# Patient Record
Sex: Male | Born: 1950 | Race: Black or African American | Hispanic: No | Marital: Married | Smoking: Current every day smoker
Health system: Southern US, Community
[De-identification: ages and names within clinical notes are randomized; demographics above are authoritative.]

## PROBLEM LIST (undated history)

## (undated) DIAGNOSIS — I10 Essential (primary) hypertension: Secondary | ICD-10-CM

## (undated) DIAGNOSIS — R413 Other amnesia: Secondary | ICD-10-CM

## (undated) DIAGNOSIS — F09 Unspecified mental disorder due to known physiological condition: Secondary | ICD-10-CM

## (undated) DIAGNOSIS — E7800 Pure hypercholesterolemia, unspecified: Secondary | ICD-10-CM

## (undated) HISTORY — DX: Other amnesia: R41.3

## (undated) HISTORY — DX: Pure hypercholesterolemia, unspecified: E78.00

## (undated) HISTORY — DX: Unspecified mental disorder due to known physiological condition: F09

## (undated) HISTORY — DX: Essential (primary) hypertension: I10

---

## 2016-10-02 ENCOUNTER — Encounter (HOSPITAL_COMMUNITY): Payer: Self-pay

## 2016-10-02 ENCOUNTER — Emergency Department (HOSPITAL_COMMUNITY): Payer: Medicare PPO

## 2016-10-02 ENCOUNTER — Emergency Department (HOSPITAL_COMMUNITY)
Admission: EM | Admit: 2016-10-02 | Discharge: 2016-10-03 | Disposition: A | Discharge status: Home / Self Care | Payer: Medicare PPO | Attending: Emergency Medicine | Admitting: Emergency Medicine

## 2016-10-02 DIAGNOSIS — Y999 Unspecified external cause status: Secondary | ICD-10-CM | POA: Insufficient documentation

## 2016-10-02 DIAGNOSIS — Y929 Unspecified place or not applicable: Secondary | ICD-10-CM | POA: Insufficient documentation

## 2016-10-02 DIAGNOSIS — W19XXXA Unspecified fall, initial encounter: Secondary | ICD-10-CM | POA: Diagnosis not present

## 2016-10-02 DIAGNOSIS — F17200 Nicotine dependence, unspecified, uncomplicated: Secondary | ICD-10-CM | POA: Insufficient documentation

## 2016-10-02 DIAGNOSIS — R4182 Altered mental status, unspecified: Secondary | ICD-10-CM | POA: Diagnosis present

## 2016-10-02 DIAGNOSIS — J09X2 Influenza due to identified novel influenza A virus with other respiratory manifestations: Secondary | ICD-10-CM | POA: Diagnosis not present

## 2016-10-02 DIAGNOSIS — Y939 Activity, unspecified: Secondary | ICD-10-CM | POA: Diagnosis not present

## 2016-10-02 DIAGNOSIS — Z79899 Other long term (current) drug therapy: Secondary | ICD-10-CM | POA: Diagnosis not present

## 2016-10-02 DIAGNOSIS — R791 Abnormal coagulation profile: Secondary | ICD-10-CM | POA: Diagnosis not present

## 2016-10-02 DIAGNOSIS — J101 Influenza due to other identified influenza virus with other respiratory manifestations: Secondary | ICD-10-CM

## 2016-10-02 LAB — COMPREHENSIVE METABOLIC PANEL
ALK PHOS: 47 U/L (ref 38–126)
ALT: 12 U/L — AB (ref 17–63)
ANION GAP: 10 (ref 5–15)
AST: 45 U/L — ABNORMAL HIGH (ref 15–41)
Albumin: 4.4 g/dL (ref 3.5–5.0)
BUN: 19 mg/dL (ref 6–20)
CALCIUM: 8.9 mg/dL (ref 8.9–10.3)
CO2: 24 mmol/L (ref 22–32)
CREATININE: 1.23 mg/dL (ref 0.61–1.24)
Chloride: 105 mmol/L (ref 101–111)
GFR, EST NON AFRICAN AMERICAN: 59 mL/min — AB (ref >60)
Glucose, Bld: 99 mg/dL (ref 65–99)
Potassium: 3.5 mmol/L (ref 3.5–5.1)
Sodium: 139 mmol/L (ref 135–145)
Total Bilirubin: 0.8 mg/dL (ref 0.3–1.2)
Total Protein: 7.1 g/dL (ref 6.5–8.1)

## 2016-10-02 LAB — CBC WITH DIFFERENTIAL/PLATELET
BASOS ABS: 0 10*3/uL (ref 0.0–0.1)
BASOS PCT: 0 %
EOS ABS: 0 10*3/uL (ref 0.0–0.7)
EOS PCT: 0 %
HCT: 40 % (ref 39.0–52.0)
HEMOGLOBIN: 14 g/dL (ref 13.0–17.0)
Lymphocytes Relative: 9 %
Lymphs Abs: 0.8 10*3/uL (ref 0.7–4.0)
MCH: 32.3 pg (ref 26.0–34.0)
MCHC: 35 g/dL (ref 30.0–36.0)
MCV: 92.2 fL (ref 78.0–100.0)
Monocytes Absolute: 1 10*3/uL (ref 0.1–1.0)
Monocytes Relative: 12 %
NEUTROS PCT: 79 %
Neutro Abs: 6.7 10*3/uL (ref 1.7–7.7)
PLATELETS: 181 10*3/uL (ref 150–400)
RBC: 4.34 MIL/uL (ref 4.22–5.81)
RDW: 13.1 % (ref 11.5–15.5)
WBC: 8.4 10*3/uL (ref 4.0–10.5)

## 2016-10-02 LAB — I-STAT CG4 LACTIC ACID, ED: LACTIC ACID, VENOUS: 1.65 mmol/L (ref 0.5–1.9)

## 2016-10-02 LAB — CK: CK TOTAL: 1227 U/L — AB (ref 49–397)

## 2016-10-02 LAB — PROTIME-INR
INR: 1.13
PROTHROMBIN TIME: 14.5 s (ref 11.4–15.2)

## 2016-10-02 MED ORDER — SODIUM CHLORIDE 0.9 % IV BOLUS (SEPSIS)
1000.0000 mL | Freq: Once | INTRAVENOUS | Status: AC
  Administered 2016-10-03: 1000 mL via INTRAVENOUS

## 2016-10-02 NOTE — ED Triage Notes (Signed)
Pt presents with c/o altered mental status. Wife at bedside reports that pt fell last night while she was out of town and when she came home this morning, he was still lying on the floor from his fall last night. Unsure as to what caused the pt to fall, possible dizziness per wife. Pt has urinated on himself at this time, has a temp of 100.8 and is tachycardic. Pt's gait was unsteady as he ambulated to triage room and he is slow to answer questions appropriately.

## 2016-10-02 NOTE — ED Provider Notes (Signed)
[facility]-EMERGENCY DEPT Provider Note   CSN: [id number] Arrival date & time: 10/02/16  2140 By signing my name below, I, Bridgette HabermannMaria Tan, attest that this documentation has been prepared under the direction and in the presence of [facility] IncJaime Rhett Mutschler, PA-C. Electronically Signed: Bridgette HabermannMaria Tan, ED Scribe. 10/03/16. 12:06 AM.  History   Chief Complaint Chief Complaint  Patient presents with  . Altered Mental Status   The history is provided by the patient and the spouse. No language interpreter was used.   HPI Comments: Eddie Morris is a 66 y.o. male with no pertinent PMHx, who presents to the Emergency Department for evaluation. Wife states she was out of town and when she returned this morning at 3 pm, he was lying on the floor in the bedroom and states he has been there since the night prior due to a fall. Pt and wife are unsure what caused the fall but wife is concerned it was due to dizziness. Wife reports that pt has been moving much slower than usual, but otherwise pt's behavior is normal. Mental status normal. Patient endorses cough which has been worsening over the past 2-3 days. Patient denied knowing of fever at home, but was febrile upon arrival today. Pt denies dizziness, chills, back pain, chest pain, shortness of breath.   History reviewed. No pertinent past medical history.  There are no active problems to display for this patient.  History reviewed. No pertinent surgical history.     Home Medications    Prior to Admission medications   Medication Sig Start Date End Date Taking? Authorizing Provider  cholecalciferol (VITAMIN D) 1000 units tablet Take 1,000 Units by mouth daily.   Yes Historical Provider, MD  Multiple Vitamin (MULTIVITAMIN WITH MINERALS) TABS tablet Take 1 tablet by mouth daily.   Yes Historical Provider, MD  oseltamivir (TAMIFLU) 75 MG capsule Take 1 capsule (75 mg total) by mouth every 12 (twelve) hours. 10/03/16   Chase PicketJaime Pilcher Ivory Maduro, PA-C    Family History No  family history on file.  Social History Social History  Substance Use Topics  . Smoking status: Current Every Day Smoker  . Smokeless tobacco: Never Used  . Alcohol use Yes     Comment: occasionally      Allergies   Patient has no known allergies.   Review of Systems Review of Systems  Constitutional: Negative for chills.  Respiratory: Positive for cough.   Musculoskeletal: Positive for gait problem.  Psychiatric/Behavioral: Negative for confusion.  All other systems reviewed and are negative.  Physical Exam Updated Vital Signs BP 144/74   Pulse 92   Temp 100.8 F (38.2 C) (Oral)   Resp (!) 57   Ht 6\' 3"  (1.905 m)   Wt 88.1 kg   SpO2 96%   BMI 24.27 kg/m   Physical Exam  Constitutional: He is oriented to person, place, and time. He appears well-developed and well-nourished. No distress.  HENT:  Head: Normocephalic and atraumatic.  Oropharynx clear, dry mucous membranes.  Cardiovascular: Normal heart sounds.  Tachycardia present.   No murmur heard. Tachycardic but regular.  Pulmonary/Chest: Effort normal and breath sounds normal. No respiratory distress.  Abdominal: Soft. He exhibits no distension. There is no tenderness.  Musculoskeletal: Normal range of motion. He exhibits no edema or tenderness.  No C/T/L spine tenderness. No tenderness to palpation of upper or lower extremities. Pelvis is stable. Full ROM without pain. 5/5 muscle strength in all four extremities.  Neurological: He is alert and oriented to person, place, and  time. No cranial nerve deficit.  Skin: Skin is warm and dry.  Nursing note and vitals reviewed.    ED Treatments / Results  DIAGNOSTIC STUDIES: Oxygen Saturation is 93% on RA, poor by my interpretation.    COORDINATION OF CARE: 12:06 AM Discussed treatment plan with pt at bedside and pt agreed to plan.  Labs (all labs ordered are listed, but only abnormal results are displayed) Labs Reviewed  COMPREHENSIVE METABOLIC PANEL -  Abnormal; Notable for the following:       Result Value   AST 45 (*)    ALT 12 (*)    GFR calc non Af Amer 59 (*)    All other components within normal limits  URINALYSIS, ROUTINE W REFLEX MICROSCOPIC - Abnormal; Notable for the following:    Hgb urine dipstick LARGE (*)    Ketones, ur 5 (*)    Squamous Epithelial / LPF 0-5 (*)    All other components within normal limits  CK - Abnormal; Notable for the following:    Total CK 1,227 (*)    All other components within normal limits  INFLUENZA PANEL BY PCR (TYPE A & B, H1N1) - Abnormal; Notable for the following:    Influenza A By PCR POSITIVE (*)    All other components within normal limits  CULTURE, BLOOD (ROUTINE X 2)  CULTURE, BLOOD (ROUTINE X 2)  URINE CULTURE  CBC WITH DIFFERENTIAL/PLATELET  PROTIME-INR  I-STAT CG4 LACTIC ACID, ED    EKG  EKG Interpretation None       Radiology Dg Chest 2 View  Result Date: 10/02/2016 CLINICAL DATA:  Altered mental status and fever EXAM: CHEST  2 VIEW COMPARISON:  None. FINDINGS: Cardiomediastinal silhouette is enlarged. The aorta is ectatic. There is mild bibasilar atelectasis without focal consolidation. No pulmonary edema. No pneumothorax or sizable pleural effusion. IMPRESSION: No active cardiopulmonary disease. Electronically Signed   By: Deatra Robinson M.D.   On: 10/02/2016 22:36   Ct Head Wo Contrast  Result Date: 10/03/2016 CLINICAL DATA:  Found in the bedroom floor fell EXAM: CT HEAD WITHOUT CONTRAST TECHNIQUE: Contiguous axial images were obtained from the base of the skull through the vertex without intravenous contrast. COMPARISON:  None. FINDINGS: Brain: No evidence of acute infarction, hemorrhage, hydrocephalus, extra-axial collection or mass lesion/mass effect. Mild to moderate periventricular white matter hypodensity consistent with small vessel disease Vascular: No hyperdense vessels. Scattered calcifications at the carotid siphon Skull: Mastoid air cells are clear.  No skull  fracture Sinuses/Orbits: Mild mucosal thickening in the ethmoid and maxillary sinuses. No fracture. Other: None IMPRESSION: 1. No CT evidence for acute intracranial abnormality 2. Mild to moderate periventricular white matter hypodensity consistent with small vessel change Electronically Signed   By: Jasmine Pang M.D.   On: 10/03/2016 01:06    Procedures Procedures (including critical care time)  Medications Ordered in ED Medications  sodium chloride 0.9 % bolus 1,000 mL (0 mLs Intravenous Stopped 10/03/16 0218)  acetaminophen (TYLENOL) tablet 650 mg (650 mg Oral Given 10/03/16 0034)     Initial Impression / Assessment and Plan / ED Course  I have reviewed the triage vital signs and the nursing notes.  Pertinent labs & imaging results that were available during my care of the patient were reviewed by me and considered in my medical decision making (see chart for details).  Clinical Course    Eddie Morris is a 66 y.o. male who presents to ED for evaluation after being found down on the  floor this afternoon by wife. Patient states he fell last night and was too weak to get up. Patient is febrile 100.8 in triage. Upon exam, temp retaken and 101.2 - Tylenol given.  No focal neuro deficits on exam and mentating appropriately. CT head negative for acute findings. CXR and urine negative. CK of 1227 - IV fluids initiated. Flu PCR + for influenza A. Wife at bedside appears very reliable. I believe patient would be safe at home with close PCP follow up. Discussed option of admission vs. home with symptomatic care and close follow up. Patient and wife at bedside opt for discharge to home. Rx for tamiflu given and risks/benefits of medication discussed. Return precautions discussed. All questions answered.   Patient seen by and discussed with Dr. Jodi Mourning who agrees with treatment plan.    Final Clinical Impressions(s) / ED Diagnoses   Final diagnoses:  Influenza A    New Prescriptions New  Prescriptions   OSELTAMIVIR (TAMIFLU) 75 MG CAPSULE    Take 1 capsule (75 mg total) by mouth every 12 (twelve) hours.   I personally performed the services described in this documentation, which was scribed in my presence. The recorded information has been reviewed and is accurate.     [facility] Sajjad Honea, PA-C 10/03/16 0401    Blane Ohara, MD 10/03/16 [phone]

## 2016-10-03 ENCOUNTER — Emergency Department (HOSPITAL_COMMUNITY): Payer: Medicare PPO

## 2016-10-03 LAB — URINALYSIS, ROUTINE W REFLEX MICROSCOPIC
BILIRUBIN URINE: NEGATIVE
Bacteria, UA: NONE SEEN
GLUCOSE, UA: NEGATIVE mg/dL
KETONES UR: 5 mg/dL — AB
LEUKOCYTES UA: NEGATIVE
Nitrite: NEGATIVE
PROTEIN: NEGATIVE mg/dL
Specific Gravity, Urine: 1.014 (ref 1.005–1.030)
pH: 5 (ref 5.0–8.0)

## 2016-10-03 LAB — INFLUENZA PANEL BY PCR (TYPE A & B)
Influenza A By PCR: POSITIVE — AB
Influenza B By PCR: NEGATIVE

## 2016-10-03 MED ORDER — OSELTAMIVIR PHOSPHATE 75 MG PO CAPS: 75.0000 mg | ORAL_CAPSULE | Freq: Two times a day (BID) | ORAL | 0 refills | Status: DC

## 2016-10-03 MED ORDER — ACETAMINOPHEN 325 MG PO TABS
650.0000 mg | ORAL_TABLET | Freq: Once | ORAL | Status: AC
  Administered 2016-10-03: 650 mg via ORAL
  Filled 2016-10-03: qty 2

## 2016-10-03 NOTE — Discharge Instructions (Signed)
Increase hydration. Alternate between Tylenol and ibuprofen as needed for fever control. Follow up with your primary care provider for discussion of today's diagnosis.  Return to ER for difficulty breathing, if you are unable to keep fluids down, uncontrollable fever, new or worsening symptoms, any additional concerns.

## 2016-10-04 LAB — URINE CULTURE

## 2016-10-08 LAB — CULTURE, BLOOD (ROUTINE X 2)
CULTURE: NO GROWTH
Culture: NO GROWTH

## 2017-10-12 ENCOUNTER — Other Ambulatory Visit: Payer: Self-pay

## 2017-10-12 ENCOUNTER — Emergency Department (HOSPITAL_COMMUNITY): Payer: Medicare PPO

## 2017-10-12 ENCOUNTER — Encounter (HOSPITAL_COMMUNITY): Payer: Self-pay

## 2017-10-12 ENCOUNTER — Emergency Department (HOSPITAL_COMMUNITY)
Admission: EM | Admit: 2017-10-12 | Discharge: 2017-10-12 | Disposition: A | Discharge status: Home / Self Care | Payer: Medicare PPO | Attending: Emergency Medicine | Admitting: Emergency Medicine

## 2017-10-12 DIAGNOSIS — Z79899 Other long term (current) drug therapy: Secondary | ICD-10-CM | POA: Insufficient documentation

## 2017-10-12 DIAGNOSIS — R413 Other amnesia: Secondary | ICD-10-CM | POA: Diagnosis not present

## 2017-10-12 DIAGNOSIS — F17200 Nicotine dependence, unspecified, uncomplicated: Secondary | ICD-10-CM | POA: Insufficient documentation

## 2017-10-12 DIAGNOSIS — R410 Disorientation, unspecified: Secondary | ICD-10-CM | POA: Diagnosis not present

## 2017-10-12 LAB — URINALYSIS, ROUTINE W REFLEX MICROSCOPIC
Bilirubin Urine: NEGATIVE
GLUCOSE, UA: NEGATIVE mg/dL
Hgb urine dipstick: NEGATIVE
Ketones, ur: 5 mg/dL — AB
LEUKOCYTES UA: NEGATIVE
Nitrite: NEGATIVE
PH: 7 (ref 5.0–8.0)
Protein, ur: NEGATIVE mg/dL
SPECIFIC GRAVITY, URINE: 1.006 (ref 1.005–1.030)

## 2017-10-12 LAB — RAPID URINE DRUG SCREEN, HOSP PERFORMED
AMPHETAMINES: NOT DETECTED
BENZODIAZEPINES: NOT DETECTED
Barbiturates: NOT DETECTED
Cocaine: NOT DETECTED
Opiates: NOT DETECTED
Tetrahydrocannabinol: NOT DETECTED

## 2017-10-12 LAB — BASIC METABOLIC PANEL
ANION GAP: 12 (ref 5–15)
BUN: 11 mg/dL (ref 6–20)
CO2: 23 mmol/L (ref 22–32)
Calcium: 9.2 mg/dL (ref 8.9–10.3)
Chloride: 104 mmol/L (ref 101–111)
Creatinine, Ser: 1.18 mg/dL (ref 0.61–1.24)
GFR calc Af Amer: >60 mL/min (ref >60)
GFR calc non Af Amer: >60 mL/min (ref >60)
GLUCOSE: 95 mg/dL (ref 65–99)
POTASSIUM: 3.7 mmol/L (ref 3.5–5.1)
Sodium: 139 mmol/L (ref 135–145)

## 2017-10-12 LAB — HEPATIC FUNCTION PANEL
ALBUMIN: 4.2 g/dL (ref 3.5–5.0)
ALK PHOS: 63 U/L (ref 38–126)
ALT: 9 U/L — ABNORMAL LOW (ref 17–63)
AST: 25 U/L (ref 15–41)
Bilirubin, Direct: <0.1 mg/dL — ABNORMAL LOW (ref 0.1–0.5)
TOTAL PROTEIN: 7.3 g/dL (ref 6.5–8.1)
Total Bilirubin: 0.9 mg/dL (ref 0.3–1.2)

## 2017-10-12 LAB — CBC WITH DIFFERENTIAL/PLATELET
BASOS ABS: 0 10*3/uL (ref 0.0–0.1)
Basophils Relative: 0 %
Eosinophils Absolute: 0 10*3/uL (ref 0.0–0.7)
Eosinophils Relative: 0 %
HEMATOCRIT: 44.3 % (ref 39.0–52.0)
Hemoglobin: 15.4 g/dL (ref 13.0–17.0)
LYMPHS PCT: 17 %
Lymphs Abs: 1.2 10*3/uL (ref 0.7–4.0)
MCH: 32.4 pg (ref 26.0–34.0)
MCHC: 34.8 g/dL (ref 30.0–36.0)
MCV: 93.3 fL (ref 78.0–100.0)
MONO ABS: 0.7 10*3/uL (ref 0.1–1.0)
Monocytes Relative: 9 %
NEUTROS ABS: 5.5 10*3/uL (ref 1.7–7.7)
Neutrophils Relative %: 74 %
Platelets: 208 10*3/uL (ref 150–400)
RBC: 4.75 MIL/uL (ref 4.22–5.81)
RDW: 12.4 % (ref 11.5–15.5)
WBC: 7.5 10*3/uL (ref 4.0–10.5)

## 2017-10-12 LAB — ETHANOL

## 2017-10-12 NOTE — ED Notes (Addendum)
Spoke with PA Sharen Hecklaudia Gibbons about patient's situation and neuro exam stated that patient would need CT and lab work.
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
## 2017-10-12 NOTE — Discharge Instructions (Signed)
1.  See your doctor as soon as possible for recheck 2.  Return to the emergency department if any new or unusual symptoms develop.

## 2017-10-12 NOTE — ED Notes (Signed)
Spoke with Sharen Hecklaudia Gibbons, PA about patient's situation and
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
## 2017-11-03 DIAGNOSIS — R413 Other amnesia: Secondary | ICD-10-CM | POA: Diagnosis not present

## 2017-11-03 DIAGNOSIS — R030 Elevated blood-pressure reading, without diagnosis of hypertension: Secondary | ICD-10-CM | POA: Diagnosis not present

## 2017-11-06 ENCOUNTER — Encounter: Payer: Self-pay | Admitting: *Deleted

## 2017-11-07 ENCOUNTER — Ambulatory Visit: Payer: Medicare PPO | Admitting: Diagnostic Neuroimaging

## 2017-11-07 ENCOUNTER — Encounter: Payer: Self-pay | Admitting: Diagnostic Neuroimaging

## 2017-11-07 VITALS — BP 194/85 | HR 87 | Ht 73.0 in | Wt 194.0 lb

## 2017-11-07 DIAGNOSIS — F0390 Unspecified dementia without behavioral disturbance: Secondary | ICD-10-CM | POA: Diagnosis not present

## 2017-11-07 DIAGNOSIS — F03A0 Unspecified dementia, mild, without behavioral disturbance, psychotic disturbance, mood disturbance, and anxiety: Secondary | ICD-10-CM

## 2017-11-07 DIAGNOSIS — R413 Other amnesia: Secondary | ICD-10-CM | POA: Diagnosis not present

## 2017-11-07 NOTE — Patient Instructions (Signed)
-   increase safety and supervision  - caregiver support resources [email or web address]  - consider donepezil and memantine  - monitor mood and behavior issues  - optimize nutrition and physical activity
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
## 2017-12-01 DIAGNOSIS — R413 Other amnesia: Secondary | ICD-10-CM | POA: Diagnosis not present

## 2017-12-01 DIAGNOSIS — I10 Essential (primary) hypertension: Secondary | ICD-10-CM | POA: Diagnosis not present

## 2018-04-02 DIAGNOSIS — I10 Essential (primary) hypertension: Secondary | ICD-10-CM | POA: Diagnosis not present

## 2018-04-02 DIAGNOSIS — Z1211 Encounter for screening for malignant neoplasm of colon: Secondary | ICD-10-CM | POA: Diagnosis not present

## 2018-04-02 DIAGNOSIS — R413 Other amnesia: Secondary | ICD-10-CM | POA: Diagnosis not present

## 2018-04-03 ENCOUNTER — Telehealth: Payer: Self-pay | Admitting: Diagnostic Neuroimaging

## 2018-04-03 ENCOUNTER — Ambulatory Visit: Payer: Medicare PPO | Admitting: Diagnostic Neuroimaging

## 2018-04-03 ENCOUNTER — Encounter: Payer: Self-pay | Admitting: Diagnostic Neuroimaging

## 2018-04-03 VITALS — BP 146/73 | HR 78 | Ht 73.0 in | Wt 189.4 lb

## 2018-04-03 DIAGNOSIS — F0390 Unspecified dementia without behavioral disturbance: Secondary | ICD-10-CM

## 2018-04-03 DIAGNOSIS — F03A0 Unspecified dementia, mild, without behavioral disturbance, psychotic disturbance, mood disturbance, and anxiety: Secondary | ICD-10-CM

## 2018-04-03 NOTE — Patient Instructions (Signed)
DEMENTIA - increase safety and supervision - caregiver support resources reviewed - may increase donepezil to 10mg  daily; may consider adding memantine 10mg  twice a day  - monitor mood and behavior issues - optimize nutrition and physical activity - follow up MRI brain (ordered last visit; not done) --> call [phone] ([facility])

## 2018-04-03 NOTE — Telephone Encounter (Signed)
Francine GravenHumana Berkley Harveyauth: [phone] (exp. 04/03/18 to 05/03/18) order sent to GI they will reach out to the pt to schedule.
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
## 2018-04-09 ENCOUNTER — Ambulatory Visit
Admission: RE | Admit: 2018-04-09 | Discharge: 2018-04-09 | Disposition: A | Discharge status: Home / Self Care | Payer: Medicare PPO | Source: Ambulatory Visit | Attending: Diagnostic Neuroimaging | Admitting: Diagnostic Neuroimaging

## 2018-04-09 DIAGNOSIS — R413 Other amnesia: Secondary | ICD-10-CM | POA: Diagnosis not present

## 2018-05-22 DIAGNOSIS — K648 Other hemorrhoids: Secondary | ICD-10-CM | POA: Diagnosis not present

## 2018-05-22 DIAGNOSIS — K5730 Diverticulosis of large intestine without perforation or abscess without bleeding: Secondary | ICD-10-CM | POA: Diagnosis not present

## 2018-05-22 DIAGNOSIS — Z1211 Encounter for screening for malignant neoplasm of colon: Secondary | ICD-10-CM | POA: Diagnosis not present

## 2018-05-22 DIAGNOSIS — D126 Benign neoplasm of colon, unspecified: Secondary | ICD-10-CM | POA: Diagnosis not present

## 2018-05-25 DIAGNOSIS — Z1211 Encounter for screening for malignant neoplasm of colon: Secondary | ICD-10-CM | POA: Diagnosis not present

## 2018-05-25 DIAGNOSIS — D126 Benign neoplasm of colon, unspecified: Secondary | ICD-10-CM | POA: Diagnosis not present

## 2018-09-07 DIAGNOSIS — Z6825 Body mass index (BMI) 25.0-25.9, adult: Secondary | ICD-10-CM | POA: Diagnosis not present

## 2018-09-07 DIAGNOSIS — I10 Essential (primary) hypertension: Secondary | ICD-10-CM | POA: Diagnosis not present

## 2018-09-07 DIAGNOSIS — F0390 Unspecified dementia without behavioral disturbance: Secondary | ICD-10-CM | POA: Diagnosis not present

## 2018-10-04 DIAGNOSIS — Z23 Encounter for immunization: Secondary | ICD-10-CM | POA: Diagnosis not present

## 2018-10-04 DIAGNOSIS — F0391 Unspecified dementia with behavioral disturbance: Secondary | ICD-10-CM | POA: Diagnosis not present

## 2018-10-04 DIAGNOSIS — I10 Essential (primary) hypertension: Secondary | ICD-10-CM | POA: Diagnosis not present

## 2018-10-04 DIAGNOSIS — F0390 Unspecified dementia without behavioral disturbance: Secondary | ICD-10-CM | POA: Diagnosis not present

## 2018-10-04 DIAGNOSIS — F17210 Nicotine dependence, cigarettes, uncomplicated: Secondary | ICD-10-CM | POA: Diagnosis not present

## 2019-05-09 IMAGING — CT CT CERVICAL SPINE W/O CM
5 of 8 series · 11 of 33 positions shown, 12 images · non-contrast
Comparison: 10/03/2016 head CT.

CLINICAL DATA: Unexplained confusion.  No reported injury.

EXAM:
CT HEAD WITHOUT CONTRAST
CT CERVICAL SPINE WITHOUT CONTRAST
TECHNIQUE: Multidetector CT imaging of the head and cervical spine was
performed following the standard protocol without intravenous
contrast. Multiplanar CT image reconstructions of the cervical spine
were also generated.

[Series 5: head bone · axial · 0.52mm/px · z∈[-66,-12]mm · 2 of 83 slices shown]
[im 28/83  bone]
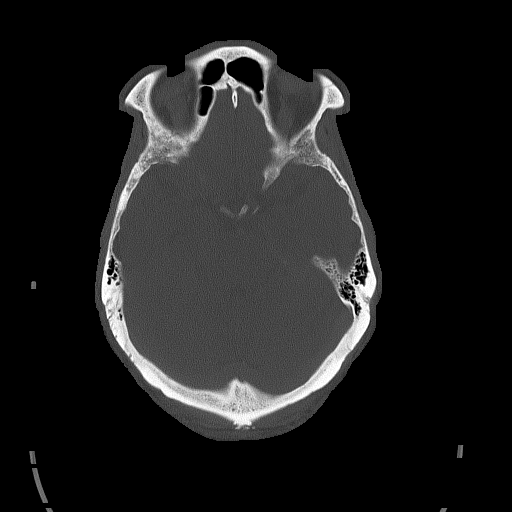
[im 55/83  bone]
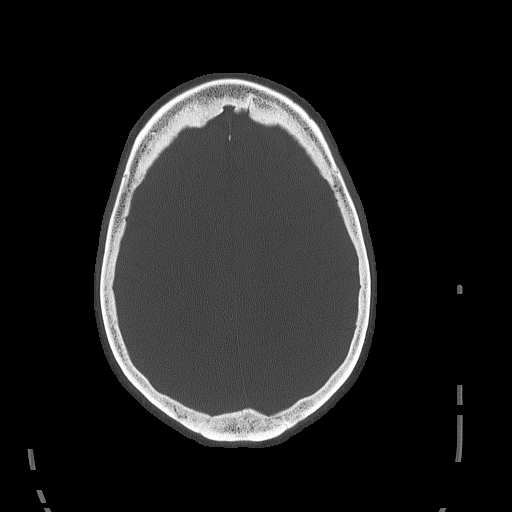

[Series 8: c_spine 2.0 st · axial · 0.37mm/px · z∈[-209,-145]mm · 2 of 96 slices shown, 3 images]
[im 32/96  soft-tissue]
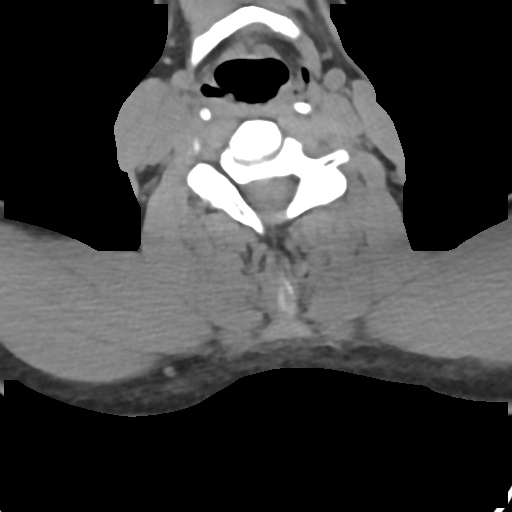
[im 32/96  bone]
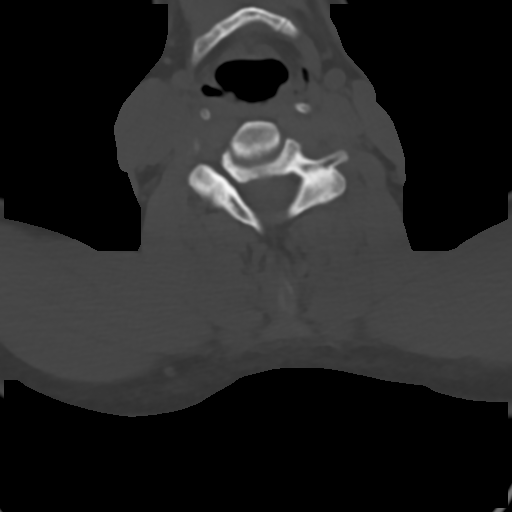
[im 64/96  bone]
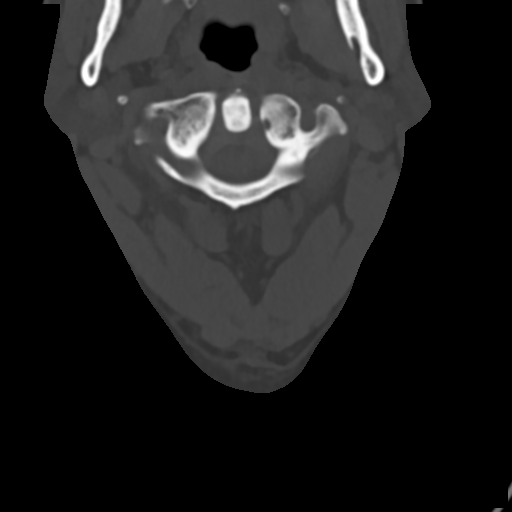

[Series 10: c_spine 2.0 sag bone · sagittal · 0.30mm/px · 4 of 64 slices shown]
[im 13/64  bone]
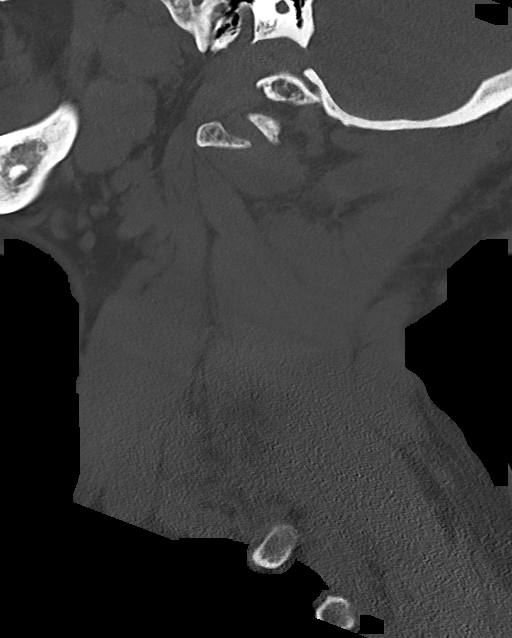
[im 26/64  bone]
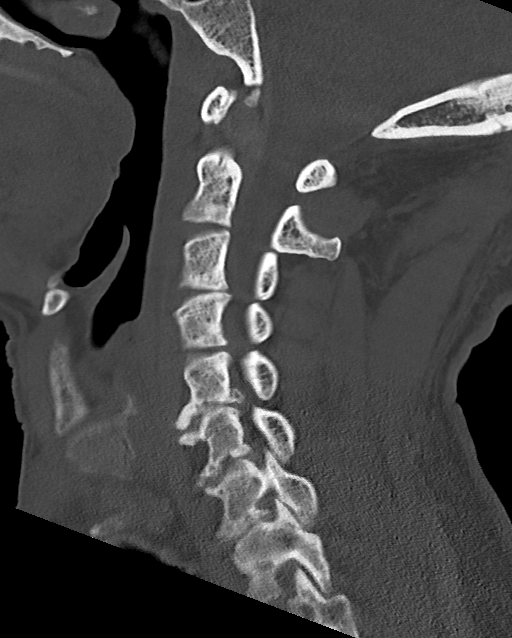
[im 38/64  bone]
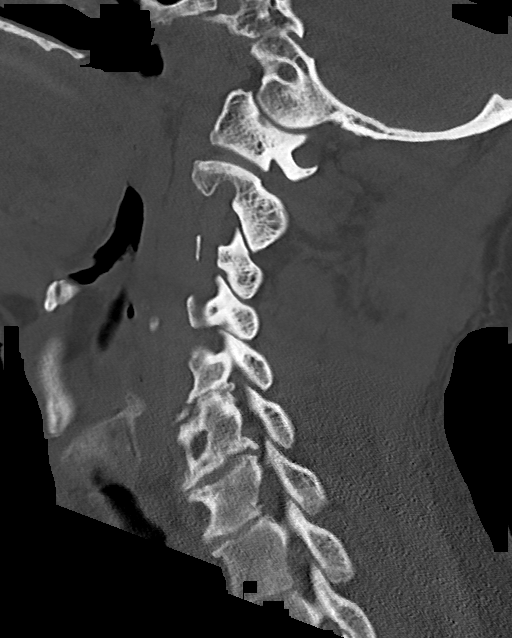
[im 51/64  bone]
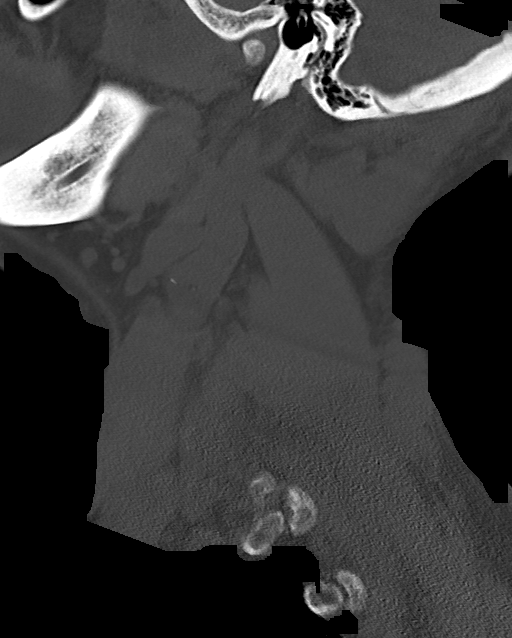

[Series 11: c_spine 2.0 cor bone · coronal · 0.28mm/px · 1 of 78 slices shown]
[im 39/78  bone]
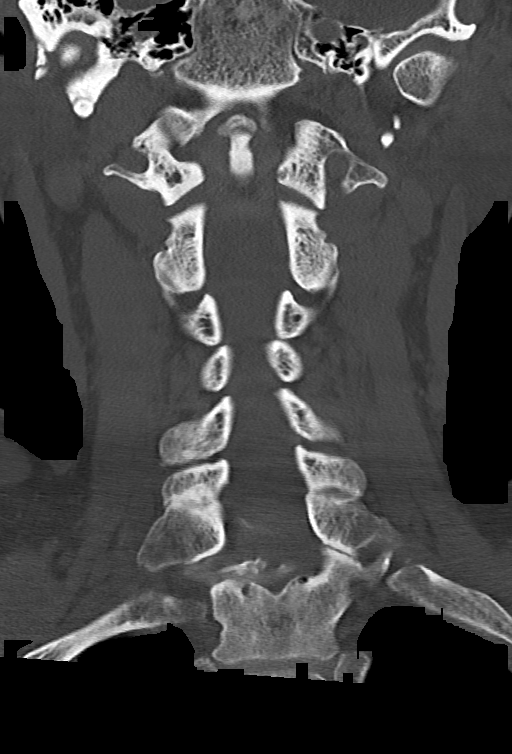

[Series 12: c_spine 2.0 orthogonals · oblique · 0.21mm/px · 2 of 86 slices shown]
[im 29/86  bone]
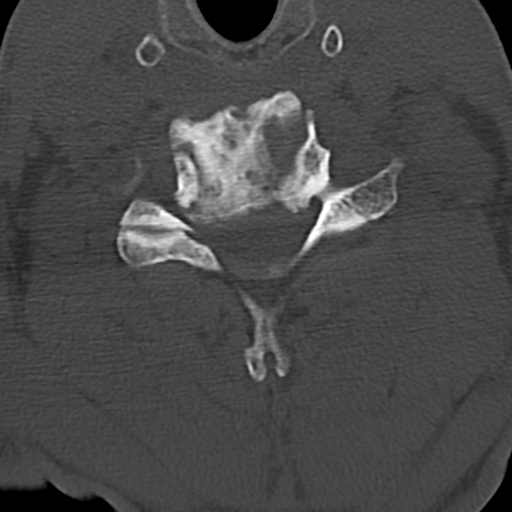
[im 57/86  bone]
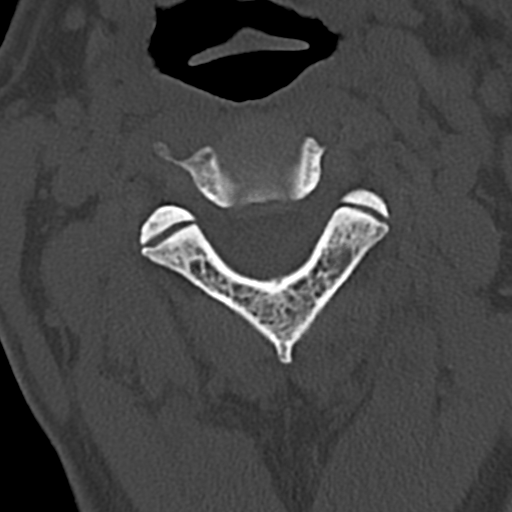

[11 of 33 positions shown; findings below may reference images not displayed]

FINDINGS: CT HEAD FINDINGS

Brain: No evidence of parenchymal hemorrhage or extra-axial fluid
collection. No mass lesion, mass effect, or midline shift. No CT
evidence of acute infarction. Nonspecific moderate subcortical and
periventricular white matter hypodensity, most in keeping with
chronic small vessel ischemic change. Cerebral volume is age
appropriate. No ventriculomegaly.

Vascular: No acute abnormality.

Skull: No evidence of calvarial fracture.

Sinuses/Orbits: The visualized paranasal sinuses are essentially
clear.

Other:  The mastoid air cells are unopacified.

CT CERVICAL SPINE FINDINGS

Alignment: Normal cervical lordosis. No facet subluxation. Dens is
well positioned between the lateral masses of C1.

Skull base and vertebrae: No acute fracture. No primary bone lesion
or focal pathologic process.

Soft tissues and spinal canal: No prevertebral edema. No visible
canal hematoma.

Disc levels: Moderate to severe multilevel degenerative disc disease
in the cervical spine, most prominent in the lower cervical spine.
Mild-to-moderate bilateral facet arthropathy. Mild to moderate
degenerative foraminal stenosis bilaterally at C3-4. Moderate right
and mild left degenerative foraminal stenosis at C5-6. Moderate
degenerative foraminal stenosis bilaterally at C6-7.

Upper chest: No acute abnormality.

Other: Visualized mastoid air cells appear clear. No discrete
thyroid nodules. No pathologically enlarged cervical nodes.
IMPRESSION: CT HEAD:

1.  No evidence of acute intracranial abnormality.
2. Moderate chronic small vessel ischemic changes in the cerebral
white matter.

CT CERVICAL SPINE:

1. No cervical spine fracture or subluxation.
2. Moderate to severe multilevel degenerative changes in the
cervical spine as detailed.

## 2019-05-17 DIAGNOSIS — I10 Essential (primary) hypertension: Secondary | ICD-10-CM | POA: Diagnosis not present

## 2019-05-17 DIAGNOSIS — F17210 Nicotine dependence, cigarettes, uncomplicated: Secondary | ICD-10-CM | POA: Diagnosis not present

## 2019-05-17 DIAGNOSIS — Z0000 Encounter for general adult medical examination without abnormal findings: Secondary | ICD-10-CM | POA: Diagnosis not present

## 2019-05-17 DIAGNOSIS — Z1389 Encounter for screening for other disorder: Secondary | ICD-10-CM | POA: Diagnosis not present

## 2019-05-17 DIAGNOSIS — F0390 Unspecified dementia without behavioral disturbance: Secondary | ICD-10-CM | POA: Diagnosis not present

## 2019-07-04 DIAGNOSIS — E7800 Pure hypercholesterolemia, unspecified: Secondary | ICD-10-CM | POA: Diagnosis not present

## 2019-11-13 DIAGNOSIS — F0390 Unspecified dementia without behavioral disturbance: Secondary | ICD-10-CM | POA: Diagnosis not present

## 2019-11-13 DIAGNOSIS — I10 Essential (primary) hypertension: Secondary | ICD-10-CM | POA: Diagnosis not present

## 2019-11-13 DIAGNOSIS — E7800 Pure hypercholesterolemia, unspecified: Secondary | ICD-10-CM | POA: Diagnosis not present

## 2019-12-31 ENCOUNTER — Encounter: Payer: Self-pay | Admitting: *Deleted

## 2020-01-01 ENCOUNTER — Ambulatory Visit: Payer: Medicare PPO | Admitting: Diagnostic Neuroimaging

## 2020-01-01 ENCOUNTER — Other Ambulatory Visit: Payer: Self-pay

## 2020-01-01 ENCOUNTER — Encounter: Payer: Self-pay | Admitting: Diagnostic Neuroimaging

## 2020-01-01 VITALS — BP 191/94 | HR 81 | Temp 97.2°F | Ht 72.0 in | Wt 201.8 lb

## 2020-01-01 DIAGNOSIS — F0391 Unspecified dementia with behavioral disturbance: Secondary | ICD-10-CM | POA: Diagnosis not present

## 2020-01-01 DIAGNOSIS — F03B18 Unspecified dementia, moderate, with other behavioral disturbance: Secondary | ICD-10-CM

## 2020-01-01 NOTE — Patient Instructions (Signed)
MODERATE DEMENTIA - continue to support safety and supervision - caregiver support resources reviewed - continue donepezil 10mg  daily + memantine 10mg  twice a day  - monitor mood and behavior issues; may consider SSRI in future - optimize nutrition and physical activity - consider melatonin for sleep
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
## 2020-05-26 DIAGNOSIS — F0391 Unspecified dementia with behavioral disturbance: Secondary | ICD-10-CM | POA: Diagnosis not present

## 2020-05-26 DIAGNOSIS — Z0000 Encounter for general adult medical examination without abnormal findings: Secondary | ICD-10-CM | POA: Diagnosis not present

## 2020-05-26 DIAGNOSIS — I10 Essential (primary) hypertension: Secondary | ICD-10-CM | POA: Diagnosis not present

## 2020-05-26 DIAGNOSIS — E7800 Pure hypercholesterolemia, unspecified: Secondary | ICD-10-CM | POA: Diagnosis not present

## 2020-06-11 DIAGNOSIS — Z23 Encounter for immunization: Secondary | ICD-10-CM | POA: Diagnosis not present

## 2020-06-11 DIAGNOSIS — I10 Essential (primary) hypertension: Secondary | ICD-10-CM | POA: Diagnosis not present

## 2020-11-23 DIAGNOSIS — F0391 Unspecified dementia with behavioral disturbance: Secondary | ICD-10-CM | POA: Diagnosis not present

## 2020-11-23 DIAGNOSIS — F17210 Nicotine dependence, cigarettes, uncomplicated: Secondary | ICD-10-CM | POA: Diagnosis not present

## 2020-11-23 DIAGNOSIS — I10 Essential (primary) hypertension: Secondary | ICD-10-CM | POA: Diagnosis not present

## 2020-11-23 DIAGNOSIS — E7800 Pure hypercholesterolemia, unspecified: Secondary | ICD-10-CM | POA: Diagnosis not present

## 2021-06-08 DIAGNOSIS — Z79899 Other long term (current) drug therapy: Secondary | ICD-10-CM | POA: Diagnosis not present

## 2021-06-08 DIAGNOSIS — Z1389 Encounter for screening for other disorder: Secondary | ICD-10-CM | POA: Diagnosis not present

## 2021-06-08 DIAGNOSIS — F0391 Unspecified dementia with behavioral disturbance: Secondary | ICD-10-CM | POA: Diagnosis not present

## 2021-06-08 DIAGNOSIS — I10 Essential (primary) hypertension: Secondary | ICD-10-CM | POA: Diagnosis not present

## 2021-06-08 DIAGNOSIS — R7989 Other specified abnormal findings of blood chemistry: Secondary | ICD-10-CM | POA: Diagnosis not present

## 2021-06-08 DIAGNOSIS — Z5181 Encounter for therapeutic drug level monitoring: Secondary | ICD-10-CM | POA: Diagnosis not present

## 2021-06-08 DIAGNOSIS — Z23 Encounter for immunization: Secondary | ICD-10-CM | POA: Diagnosis not present

## 2021-06-08 DIAGNOSIS — Z0000 Encounter for general adult medical examination without abnormal findings: Secondary | ICD-10-CM | POA: Diagnosis not present

## 2021-06-08 DIAGNOSIS — E7800 Pure hypercholesterolemia, unspecified: Secondary | ICD-10-CM | POA: Diagnosis not present

## 2021-06-16 DIAGNOSIS — R7989 Other specified abnormal findings of blood chemistry: Secondary | ICD-10-CM | POA: Diagnosis not present

## 2021-12-08 DIAGNOSIS — R739 Hyperglycemia, unspecified: Secondary | ICD-10-CM | POA: Diagnosis not present

## 2021-12-08 DIAGNOSIS — F0390 Unspecified dementia without behavioral disturbance: Secondary | ICD-10-CM | POA: Diagnosis not present

## 2021-12-08 DIAGNOSIS — I10 Essential (primary) hypertension: Secondary | ICD-10-CM | POA: Diagnosis not present

## 2021-12-08 DIAGNOSIS — N1831 Chronic kidney disease, stage 3a: Secondary | ICD-10-CM | POA: Diagnosis not present

## 2021-12-08 DIAGNOSIS — E7800 Pure hypercholesterolemia, unspecified: Secondary | ICD-10-CM | POA: Diagnosis not present

## 2021-12-10 DIAGNOSIS — R7309 Other abnormal glucose: Secondary | ICD-10-CM | POA: Diagnosis not present

## 2021-12-10 DIAGNOSIS — R739 Hyperglycemia, unspecified: Secondary | ICD-10-CM | POA: Diagnosis not present

## 2022-02-10 DIAGNOSIS — H35033 Hypertensive retinopathy, bilateral: Secondary | ICD-10-CM | POA: Diagnosis not present

## 2022-05-08 ENCOUNTER — Encounter (HOSPITAL_COMMUNITY): Payer: Self-pay

## 2022-05-08 ENCOUNTER — Observation Stay (HOSPITAL_COMMUNITY): Payer: Medicare PPO

## 2022-05-08 ENCOUNTER — Other Ambulatory Visit: Payer: Self-pay

## 2022-05-08 ENCOUNTER — Emergency Department (HOSPITAL_COMMUNITY): Payer: Medicare PPO

## 2022-05-08 ENCOUNTER — Observation Stay (HOSPITAL_COMMUNITY)
Admission: EM | Admit: 2022-05-08 | Discharge: 2022-05-10 | Disposition: A | Discharge status: Skilled Nursing Facility | Payer: Medicare PPO | Attending: Internal Medicine | Admitting: Internal Medicine

## 2022-05-08 DIAGNOSIS — E7800 Pure hypercholesterolemia, unspecified: Secondary | ICD-10-CM | POA: Diagnosis not present

## 2022-05-08 DIAGNOSIS — Z720 Tobacco use: Secondary | ICD-10-CM | POA: Diagnosis present

## 2022-05-08 DIAGNOSIS — Z91040 Latex allergy status: Secondary | ICD-10-CM | POA: Insufficient documentation

## 2022-05-08 DIAGNOSIS — R519 Headache, unspecified: Secondary | ICD-10-CM | POA: Diagnosis not present

## 2022-05-08 DIAGNOSIS — F03B18 Unspecified dementia, moderate, with other behavioral disturbance: Secondary | ICD-10-CM | POA: Diagnosis not present

## 2022-05-08 DIAGNOSIS — R262 Difficulty in walking, not elsewhere classified: Secondary | ICD-10-CM | POA: Diagnosis not present

## 2022-05-08 DIAGNOSIS — R2681 Unsteadiness on feet: Secondary | ICD-10-CM | POA: Diagnosis not present

## 2022-05-08 DIAGNOSIS — F17210 Nicotine dependence, cigarettes, uncomplicated: Secondary | ICD-10-CM | POA: Insufficient documentation

## 2022-05-08 DIAGNOSIS — R791 Abnormal coagulation profile: Secondary | ICD-10-CM | POA: Insufficient documentation

## 2022-05-08 DIAGNOSIS — I129 Hypertensive chronic kidney disease with stage 1 through stage 4 chronic kidney disease, or unspecified chronic kidney disease: Secondary | ICD-10-CM | POA: Diagnosis not present

## 2022-05-08 DIAGNOSIS — R269 Unspecified abnormalities of gait and mobility: Secondary | ICD-10-CM | POA: Diagnosis not present

## 2022-05-08 DIAGNOSIS — F0390 Unspecified dementia without behavioral disturbance: Secondary | ICD-10-CM | POA: Diagnosis not present

## 2022-05-08 DIAGNOSIS — Z79899 Other long term (current) drug therapy: Secondary | ICD-10-CM | POA: Insufficient documentation

## 2022-05-08 DIAGNOSIS — N1831 Chronic kidney disease, stage 3a: Secondary | ICD-10-CM | POA: Diagnosis not present

## 2022-05-08 DIAGNOSIS — F03918 Unspecified dementia, unspecified severity, with other behavioral disturbance: Secondary | ICD-10-CM | POA: Diagnosis present

## 2022-05-08 DIAGNOSIS — R7402 Elevation of levels of lactic acid dehydrogenase (LDH): Secondary | ICD-10-CM | POA: Insufficient documentation

## 2022-05-08 DIAGNOSIS — I10 Essential (primary) hypertension: Secondary | ICD-10-CM | POA: Diagnosis present

## 2022-05-08 DIAGNOSIS — R531 Weakness: Secondary | ICD-10-CM

## 2022-05-08 LAB — DIFFERENTIAL
Abs Immature Granulocytes: 0.02 10*3/uL (ref 0.00–0.07)
Basophils Absolute: 0 10*3/uL (ref 0.0–0.1)
Basophils Relative: 0 %
Eosinophils Absolute: 0.1 10*3/uL (ref 0.0–0.5)
Eosinophils Relative: 1 %
Immature Granulocytes: 0 %
Lymphocytes Relative: 6 %
Lymphs Abs: 0.5 10*3/uL — ABNORMAL LOW (ref 0.7–4.0)
Monocytes Absolute: 1 10*3/uL (ref 0.1–1.0)
Monocytes Relative: 14 %
Neutro Abs: 5.7 10*3/uL (ref 1.7–7.7)
Neutrophils Relative %: 79 %

## 2022-05-08 LAB — COMPREHENSIVE METABOLIC PANEL
ALT: 10 U/L (ref 0–44)
AST: 20 U/L (ref 15–41)
Albumin: 4.2 g/dL (ref 3.5–5.0)
Alkaline Phosphatase: 56 U/L (ref 38–126)
Anion gap: 10 (ref 5–15)
BUN: 8 mg/dL (ref 8–23)
CO2: 22 mmol/L (ref 22–32)
Calcium: 9.2 mg/dL (ref 8.9–10.3)
Chloride: 107 mmol/L (ref 98–111)
Creatinine, Ser: 1.37 mg/dL — ABNORMAL HIGH (ref 0.61–1.24)
GFR, Estimated: 54 mL/min — ABNORMAL LOW (ref >60)
Glucose, Bld: 88 mg/dL (ref 70–99)
Potassium: 3.8 mmol/L (ref 3.5–5.1)
Sodium: 139 mmol/L (ref 135–145)
Total Bilirubin: 0.9 mg/dL (ref 0.3–1.2)
Total Protein: 7.1 g/dL (ref 6.5–8.1)

## 2022-05-08 LAB — I-STAT CHEM 8, ED
BUN: 9 mg/dL (ref 8–23)
Calcium, Ion: 0.98 mmol/L — ABNORMAL LOW (ref 1.15–1.40)
Chloride: 107 mmol/L (ref 98–111)
Creatinine, Ser: 1.3 mg/dL — ABNORMAL HIGH (ref 0.61–1.24)
Glucose, Bld: 90 mg/dL (ref 70–99)
HCT: 44 % (ref 39.0–52.0)
Hemoglobin: 15 g/dL (ref 13.0–17.0)
Potassium: 3.6 mmol/L (ref 3.5–5.1)
Sodium: 140 mmol/L (ref 135–145)
TCO2: 24 mmol/L (ref 22–32)

## 2022-05-08 LAB — CBC
HCT: 42.3 % (ref 39.0–52.0)
Hemoglobin: 14.5 g/dL (ref 13.0–17.0)
MCH: 32 pg (ref 26.0–34.0)
MCHC: 34.3 g/dL (ref 30.0–36.0)
MCV: 93.4 fL (ref 80.0–100.0)
Platelets: 211 10*3/uL (ref 150–400)
RBC: 4.53 MIL/uL (ref 4.22–5.81)
RDW: 12.7 % (ref 11.5–15.5)
WBC: 7.2 10*3/uL (ref 4.0–10.5)
nRBC: 0 % (ref 0.0–0.2)

## 2022-05-08 LAB — PROTIME-INR
INR: 1.1 (ref 0.8–1.2)
Prothrombin Time: 13.6 seconds (ref 11.4–15.2)

## 2022-05-08 LAB — LACTIC ACID, PLASMA: Lactic Acid, Venous: 2.1 mmol/L (ref 0.5–1.9)

## 2022-05-08 LAB — APTT: aPTT: 22 seconds — ABNORMAL LOW (ref 24–36)

## 2022-05-08 LAB — TSH: TSH: 0.539 u[IU]/mL (ref 0.350–4.500)

## 2022-05-08 LAB — VITAMIN B12: Vitamin B-12: 284 pg/mL (ref 180–914)

## 2022-05-08 MED ORDER — LORATADINE 10 MG PO TABS
10.0000 mg | ORAL_TABLET | Freq: Every day | ORAL | Status: DC
  Administered 2022-05-08 – 2022-05-10 (×3): 10 mg via ORAL
  Filled 2022-05-08 (×3): qty 1

## 2022-05-08 MED ORDER — ROSUVASTATIN CALCIUM 5 MG PO TABS
10.0000 mg | ORAL_TABLET | Freq: Every day | ORAL | Status: DC
  Administered 2022-05-09 – 2022-05-10 (×2): 10 mg via ORAL
  Filled 2022-05-08 (×2): qty 2

## 2022-05-08 MED ORDER — ENOXAPARIN SODIUM 40 MG/0.4ML IJ SOSY
40.0000 mg | PREFILLED_SYRINGE | INTRAMUSCULAR | Status: DC
Start: 2022-05-08 — End: 2022-05-11
  Administered 2022-05-08 – 2022-05-10 (×3): 40 mg via SUBCUTANEOUS
  Filled 2022-05-08 (×3): qty 0.4

## 2022-05-08 MED ORDER — SODIUM CHLORIDE 0.9 % IV SOLN: INTRAVENOUS | Status: AC

## 2022-05-08 MED ORDER — ACETAMINOPHEN 325 MG PO TABS
650.0000 mg | ORAL_TABLET | Freq: Four times a day (QID) | ORAL | Status: DC | PRN
  Administered 2022-05-09: 650 mg via ORAL
  Filled 2022-05-08: qty 2

## 2022-05-08 MED ORDER — ACETAMINOPHEN 650 MG RE SUPP: 650.0000 mg | Freq: Four times a day (QID) | RECTAL | Status: DC | PRN

## 2022-05-08 MED ORDER — DONEPEZIL HCL 10 MG PO TABS
10.0000 mg | ORAL_TABLET | Freq: Every day | ORAL | Status: DC
  Administered 2022-05-08 – 2022-05-10 (×3): 10 mg via ORAL
  Filled 2022-05-08 (×3): qty 1

## 2022-05-08 MED ORDER — AMLODIPINE BESYLATE 10 MG PO TABS
10.0000 mg | ORAL_TABLET | Freq: Every day | ORAL | Status: DC
  Administered 2022-05-09 – 2022-05-10 (×2): 10 mg via ORAL
  Filled 2022-05-08: qty 1
  Filled 2022-05-08: qty 2

## 2022-05-08 MED ORDER — MEMANTINE HCL 10 MG PO TABS
10.0000 mg | ORAL_TABLET | Freq: Two times a day (BID) | ORAL | Status: DC
  Administered 2022-05-08 – 2022-05-10 (×5): 10 mg via ORAL
  Filled 2022-05-08 (×5): qty 1

## 2022-05-08 NOTE — Assessment & Plan Note (Signed)
Does not hardly smoke if any  Declines nicotine patch

## 2022-05-08 NOTE — Assessment & Plan Note (Signed)
Baseline from PCP office appears to be 1.2-1.4 Stable Continue to monitor

## 2022-05-08 NOTE — ED Provider Notes (Signed)
[facility] EMERGENCY DEPARTMENT Provider Note   CSN: [id number] Arrival date & time: 05/08/22  1126     History  Chief Complaint  Patient presents with   Weakness   Gait Problem    Titus Drone is a 71 y.o. male.  71 yo M with a chief complaints of difficulty walking.  This started this morning.  The wife said he was normal yesterday.  Has a history of dementia.  He was unable to dress himself as well.  No obvious infectious symptoms no loss of bowel or bladder no back pain.  He is a bit more confused than normal.  Denies cough congestion or fever.  Denies recent immunization denies recent medication change.  Wife describes some eye twitching that seem to occur off and on but was new for him as well.  Feels more on the right than the left.   Weakness      Home Medications Prior to Admission medications   Medication Sig Start Date End Date Taking? Authorizing Provider  amLODipine (NORVASC) 10 MG tablet Take 10 mg by mouth daily. 03/12/18   [provider]  cholecalciferol (VITAMIN D) 1000 units tablet Take 1,000 Units by mouth daily.    [provider]  donepezil (ARICEPT) 10 MG tablet 10 mg daily. 12/04/19   [provider]  memantine (NAMENDA) 10 MG tablet 10 mg 2 (two) times daily. 10/21/19   [provider]  Multiple Vitamin (MULTIVITAMIN WITH MINERALS) TABS tablet Take 1 tablet by mouth daily.    [provider]  rosuvastatin (CRESTOR) 10 MG tablet 10 mg daily. 11/13/19   [provider]      Allergies    Latex    Review of Systems   Review of Systems  Neurological:  Positive for weakness.    Physical Exam Updated Vital Signs BP 134/62 (BP Location: Right Arm)   Pulse 88   Temp 98.1 F (36.7 C) (Oral)   Resp 18   Ht 6' (1.829 m)   Wt 79.4 kg   SpO2 95%   BMI 23.73 kg/m  Physical Exam Vitals and nursing note reviewed.  Constitutional:      Appearance: He is well-developed.  HENT:      Head: Normocephalic and atraumatic.  Eyes:     Pupils: Pupils are equal, round, and reactive to light.  Neck:     Vascular: No JVD.  Cardiovascular:     Rate and Rhythm: Normal rate and regular rhythm.     Heart sounds: No murmur heard.    No friction rub. No gallop.  Pulmonary:     Effort: No respiratory distress.     Breath sounds: No wheezing.  Abdominal:     General: There is no distension.     Tenderness: There is no abdominal tenderness. There is no guarding or rebound.  Musculoskeletal:        General: Normal range of motion.     Cervical back: Normal range of motion and neck supple.  Skin:    Coloration: Skin is not pale.     Findings: No rash.  Neurological:     Mental Status: He is alert.     Comments: Significant weakness to bilateral lower extremities.  He will not lift either off the bed without the use of his hands.  He was able to stand and take a few steps under his own weight had a very shaky gait and had some instability but did not fall with  some assistance.  Psychiatric:        Behavior: Behavior normal.     ED Results / Procedures / Treatments   Labs (all labs ordered are listed, but only abnormal results are displayed) Labs Reviewed  APTT - Abnormal; Notable for the following components:      Result Value   aPTT 22 (*)    All other components within normal limits  DIFFERENTIAL - Abnormal; Notable for the following components:   Lymphs Abs 0.5 (*)    All other components within normal limits  COMPREHENSIVE METABOLIC PANEL - Abnormal; Notable for the following components:   Creatinine, Ser 1.37 (*)    GFR, Estimated 54 (*)    All other components within normal limits  I-STAT CHEM 8, ED - Abnormal; Notable for the following components:   Creatinine, Ser 1.30 (*)    Calcium, Ion 0.98 (*)    All other components within normal limits  PROTIME-INR  CBC  LACTIC ACID, PLASMA  LACTIC ACID, PLASMA    EKG EKG Interpretation  Date/Time:  Sunday  May 08 2022 12:30:07 EDT Ventricular Rate:  84 PR Interval:  202 QRS Duration: 98 QT Interval:  354 QTC Calculation: 418 R Axis:   -57 Text Interpretation: Sinus rhythm with marked sinus arrhythmia with occasional Premature ventricular complexes Left anterior fascicular block Left ventricular hypertrophy with repolarization abnormality ( R in aVL , Cornell product , Romhilt-Estes ) Abnormal ECG No old tracing to compare Confirmed by Melene Plan [id number]) on 05/08/2022 2:26:42 PM  Radiology CT HEAD WO CONTRAST  Result Date: 05/08/2022 CLINICAL DATA:  Headache, new or worsening. Eye twitching unsteady gait. EXAM: CT HEAD WITHOUT CONTRAST TECHNIQUE: Contiguous axial images were obtained from the base of the skull through the vertex without intravenous contrast. RADIATION DOSE REDUCTION: This exam was performed according to the departmental dose-optimization program which includes automated exposure control, adjustment of the mA and/or kV according to patient size and/or use of iterative reconstruction technique. COMPARISON:  CT examination dated October 03, 2016. FINDINGS: Brain: No evidence of acute infarction, hemorrhage, hydrocephalus, extra-axial collection or mass lesion/mass effect. Mild cerebral volume loss and low-attenuation of the periventricular white matter presumed chronic microvascular ischemic changes. Vascular: No hyperdense vessel or unexpected calcification. Skull: Normal. Negative for fracture or focal lesion. Sinuses/Orbits: No acute finding. Other: None. IMPRESSION: 1.  No acute intracranial abnormality. 2. Mild cerebral volume loss and chronic microvascular ischemic changes of the white matter, not significantly changed. Electronically Signed   By: Larose Hires D.O.   On: 05/08/2022 13:00    Procedures Procedures    Medications Ordered in ED Medications - No data to display  ED Course/ Medical Decision Making/ A&P                           Medical Decision Making Amount  and/or Complexity of Data Reviewed Labs: ordered. Radiology: ordered.  Risk Decision regarding hospitalization.   71 yo M with a chief complaints of gait instability and eye twitching.  This was noted by the wife this morning.  New for him.  On my exam the patient had a few staring spells.  Seems like he was easily redirectable during these.  He also has bilateral lower extremity weakness with diminished reflexes bilaterally.  I discussed the case with neurology, will evaluate at bedside.  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  Workup thus far without significant electrolyte abnormality no significant anemia.  The patients results and plan were reviewed and discussed.   Any x-rays performed were independently reviewed by myself.   Differential diagnosis were considered with the presenting HPI.  Medications - No data to display  Vitals:   05/08/22 1226 05/08/22 1227  BP: 134/62   Pulse: 88   Resp: 18   Temp: 98.1 F (36.7 C)   TempSrc: Oral   SpO2: 95%   Weight:  79.4 kg  Height:  6' (1.829 m)    Final diagnoses:  Gait instability    Admission/ observation were discussed with the admitting physician, patient and/or family and they are comfortable with the plan.          Final Clinical Impression(s) / ED Diagnoses Final diagnoses:  Gait instability    Rx / DC Orders ED Discharge Orders     None         Melene Plan, DO 05/08/22 1527

## 2022-05-08 NOTE — ED Triage Notes (Signed)
Pt arrived POV from home c/o eye twitching, unsteady gait, unable to dress himself, per wife this started at 5am this morning. Pt has a hx of dementia but normally can ambulate fine and dress himself but this morning after stating he did not feel like he could get his legs in the bed that he has not been able to do the things he normally would.

## 2022-05-08 NOTE — Assessment & Plan Note (Addendum)
Slightly slower to process than normal per wife Continue aricept and namenda Delirium precautions

## 2022-05-08 NOTE — Assessment & Plan Note (Signed)
Continue crestor 10mg daily  
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
## 2022-05-08 NOTE — ED Provider Triage Note (Signed)
Emergency Medicine Provider Triage Evaluation Note  Eddie Morris , a 71 y.o. male  was evaluated in triage.  Patient presents with his wife.  Patient has history of dementia.  Wife provides most of the history.  She states since 5 AM this morning patient has had balance issue, twitching of the right eye.  Review of Systems  Positive: As above Negative: As above  Physical Exam  BP 134/62 (BP Location: Right Arm)   Pulse 88   Temp 98.1 F (36.7 C) (Oral)   Resp 18   Ht 6' (1.829 m)   Wt 79.4 kg   SpO2 95%   BMI 23.73 kg/m  Gen:   Awake, no distress   Resp:  Normal effort  MSK:   Moves extremities without difficulty  Other:  Without facial droop.  Normal speech.  Range of motion bilateral upper and lower extremities with 5/5 strength in both upper and lower extremities.  Sensation intact.  Medical Decision Making  Medically screening exam initiated at 12:30 PM.  Appropriate orders placed.  Noemi Rolland was informed that the remainder of the evaluation will be completed by another provider, this initial triage assessment does not replace that evaluation, and the importance of remaining in the ED until their evaluation is complete.  Labs and CT head ordered.   Marita Kansas, PA-C 05/08/22 1232
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
## 2022-05-08 NOTE — Assessment & Plan Note (Signed)
Well  Controlled, continue 10mg  norvasc daily

## 2022-05-08 NOTE — Assessment & Plan Note (Addendum)
71 year old male with history of moderate dementia presenting with bilateral LE weakness, gait instability and some change from his cognitive baseline.  -obs to tele  -neurology consulted and examined. He was able to walk with them with no deficits/weakness and exam did not show any LE weakness. Seems to be slower to process questions/commands/executive functioning. Neurology to sign off for now, no concern for GBS.  -CT head with no acute finding, brain MRI pending -will continue with frequent neuro checks  -gentle IVF overnight -check TSH/B12  -check orthostatic vitals  -PT/OT to assess

## 2022-05-08 NOTE — Progress Notes (Signed)
In regards to pt's ordered MRI, was able to obtain limited study including AX DWI, AX T2 FLAIR, and motion degraded AX SWAN before pt began moving head continuously, raising up arms and removing head coil required for imaging. Scan aborted for pt safety. Sent obtained imaging and completed exam as a limited study. Best possible images obtained.

## 2022-05-09 ENCOUNTER — Observation Stay (HOSPITAL_COMMUNITY): Payer: Medicare PPO

## 2022-05-09 DIAGNOSIS — F03918 Unspecified dementia, unspecified severity, with other behavioral disturbance: Secondary | ICD-10-CM

## 2022-05-09 DIAGNOSIS — N1831 Chronic kidney disease, stage 3a: Secondary | ICD-10-CM

## 2022-05-09 DIAGNOSIS — E7800 Pure hypercholesterolemia, unspecified: Secondary | ICD-10-CM

## 2022-05-09 DIAGNOSIS — R4182 Altered mental status, unspecified: Secondary | ICD-10-CM | POA: Diagnosis not present

## 2022-05-09 DIAGNOSIS — R2681 Unsteadiness on feet: Secondary | ICD-10-CM

## 2022-05-09 DIAGNOSIS — I10 Essential (primary) hypertension: Secondary | ICD-10-CM | POA: Diagnosis not present

## 2022-05-09 DIAGNOSIS — Z720 Tobacco use: Secondary | ICD-10-CM

## 2022-05-09 LAB — URINALYSIS, ROUTINE W REFLEX MICROSCOPIC
Bilirubin Urine: NEGATIVE
Glucose, UA: NEGATIVE mg/dL
Hgb urine dipstick: NEGATIVE
Ketones, ur: NEGATIVE mg/dL
Leukocytes,Ua: NEGATIVE
Nitrite: NEGATIVE
Protein, ur: NEGATIVE mg/dL
Specific Gravity, Urine: 1.02 (ref 1.005–1.030)
pH: 6 (ref 5.0–8.0)

## 2022-05-09 LAB — BASIC METABOLIC PANEL
Anion gap: 12 (ref 5–15)
BUN: 7 mg/dL — ABNORMAL LOW (ref 8–23)
CO2: 21 mmol/L — ABNORMAL LOW (ref 22–32)
Calcium: 8.6 mg/dL — ABNORMAL LOW (ref 8.9–10.3)
Chloride: 105 mmol/L (ref 98–111)
Creatinine, Ser: 1.15 mg/dL (ref 0.61–1.24)
GFR, Estimated: >60 mL/min (ref >60)
Glucose, Bld: 81 mg/dL (ref 70–99)
Potassium: 3.8 mmol/L (ref 3.5–5.1)
Sodium: 138 mmol/L (ref 135–145)

## 2022-05-09 LAB — CBC
HCT: 47.7 % (ref 39.0–52.0)
Hemoglobin: 15.6 g/dL (ref 13.0–17.0)
MCH: 31.9 pg (ref 26.0–34.0)
MCHC: 32.7 g/dL (ref 30.0–36.0)
MCV: 97.5 fL (ref 80.0–100.0)
Platelets: 149 10*3/uL — ABNORMAL LOW (ref 150–400)
RBC: 4.89 MIL/uL (ref 4.22–5.81)
RDW: 12.6 % (ref 11.5–15.5)
WBC: 7.6 10*3/uL (ref 4.0–10.5)
nRBC: 0 % (ref 0.0–0.2)

## 2022-05-09 LAB — LACTIC ACID, PLASMA
Lactic Acid, Venous: 2.2 mmol/L (ref 0.5–1.9)
Lactic Acid, Venous: 2.1 mmol/L (ref 0.5–1.9)
Lactic Acid, Venous: 1.2 mmol/L (ref 0.5–1.9)

## 2022-05-09 MED ORDER — SODIUM CHLORIDE 0.9 % IV SOLN: INTRAVENOUS | Status: AC

## 2022-05-09 MED ORDER — VITAMIN B-12 1000 MCG PO TABS
1000.0000 ug | ORAL_TABLET | Freq: Every day | ORAL | Status: DC
  Administered 2022-05-09 – 2022-05-10 (×2): 1000 ug via ORAL
  Filled 2022-05-09 (×2): qty 1

## 2022-05-09 NOTE — Progress Notes (Signed)
PROGRESS NOTE  Eddie Morris PFX:[id number] DOB: 1951-04-03 DOA: 05/08/2022 PCP: Renford Dills, MD  HPI/Recap of past 24 hours: Eddie Morris is a 71 y.o. male with medical history significant of dementia, HTN, HLD, CKD 3a who presented to ED with gait abnormality, staring spells with eye twitching, inability to perform his ADLs as he would. Noted significant decline recently with some disorientation. In the ED, creatinine: 1.37, Ct head: no acute findings, neurology consulted, TRH asked to admit.     Today, pt oriented to self only. Denies any new complaints. Appears disoriented. Unsure why he is in the hospital.   Assessment/Plan: Principal Problem:   Gait instability Active Problems:   Chronic kidney disease, stage 3a (HCC)   Dementia with behavioral disturbance (HCC)   Essential hypertension   Pure hypercholesterolemia   Tobacco user   Gait instability Presenting with bilateral LE weakness, gait instability and some change from his cognitive baseline CT head with no acute findings MRI brain with limited evaluation, with no evidence of acute or subacute infarct Neurology consulted, no further recs PT/OT rec SNF  AMS Moderate dementia Likely 2/2 progressive decline CVA ruled out Infection r/o: UA neg, CXR unremarkable TSH WNL, Vit B12-->284, will replace Monitor closely  Elevated lactic acid Mildly elevated at 2.2 Likely 2/2 poor oral intake Vs infection r/o Trend LA Continue IVF  Chronic kidney disease, stage 3a (HCC) Baseline from PCP office appears to be 1.2-1.4 Stable Daily BMP   Tobacco user Does not hardly smoke if any  Declines nicotine patch    Pure hypercholesterolemia Continue crestor 10mg  daily    Essential hypertension Stable Continue 10mg  norvasc daily    Dementia with behavioral disturbance (HCC) Likely progressing Continue aricept and namenda Delirium precautions        Estimated body mass index is 23.73 kg/m as  calculated from the following:   Height as of this encounter: 6' (1.829 m).   Weight as of this encounter: 79.4 kg.     Code Status: Full  Family Communication: None at bedside  Disposition Plan: Status is: Observation The patient remains OBS appropriate and will d/c before 2 midnights.      Consultants: Neurology  Procedures: None  Antimicrobials: None  DVT prophylaxis:  Lovenox   Objective: Vitals:   05/09/22 0945 05/09/22 1145 05/09/22 1214 05/09/22 1313  BP: (!) 133/97 (!) 151/92  118/74  Pulse:    (!) 54  Resp: 18 18  19   Temp:   98.7 F (37.1 C)   TempSrc:   Oral   SpO2:    97%  Weight:      Height:       No intake or output data in the 24 hours ending 05/09/22 1436 Filed Weights   05/08/22 1227  Weight: 79.4 kg    Exam:  General: NAD, oriented to self only Cardiovascular: S1, S2 present Respiratory: CTAB Abdomen: Soft, nontender, nondistended, bowel sounds present Musculoskeletal: No bilateral pedal edema noted Skin: Normal Psychiatry: Unable to assess     Data Reviewed: CBC: Recent Labs  Lab 05/08/22 1229 05/08/22 1250 05/09/22 0826  WBC 7.2  --  7.6  NEUTROABS 5.7  --   --   HGB 14.5 15.0 15.6  HCT 42.3 44.0 47.7  MCV 93.4  --  97.5  PLT 211  --  149*   Basic Metabolic Panel: Recent Labs  Lab 05/08/22 1229 05/08/22 1250 05/09/22 0826  NA 139 140 138  K 3.8 3.6 3.8  CL 107 107  105  CO2 22  --  21*  GLUCOSE 88 90 81  BUN 8 9 7*  CREATININE 1.37* 1.30* 1.15  CALCIUM 9.2  --  8.6*   GFR: Estimated Creatinine Clearance: 62.8 mL/min (by C-G formula based on SCr of 1.15 mg/dL). Liver Function Tests: Recent Labs  Lab 05/08/22 1229  AST 20  ALT 10  ALKPHOS 56  BILITOT 0.9  PROT 7.1  ALBUMIN 4.2   No results for input(s): "LIPASE", "AMYLASE" in the last 168 hours. No results for input(s): "AMMONIA" in the last 168 hours. Coagulation Profile: Recent Labs  Lab 05/08/22 1229  INR 1.1   Cardiac Enzymes: No  results for input(s): "CKTOTAL", "CKMB", "CKMBINDEX", "TROPONINI" in the last 168 hours. BNP (last 3 results) No results for input(s): "PROBNP" in the last 8760 hours. HbA1C: No results for input(s): "HGBA1C" in the last 72 hours. CBG: No results for input(s): "GLUCAP" in the last 168 hours. Lipid Profile: No results for input(s): "CHOL", "HDL", "LDLCALC", "TRIG", "CHOLHDL", "LDLDIRECT" in the last 72 hours. Thyroid Function Tests: Recent Labs    05/08/22 1756  TSH 0.539   Anemia Panel: Recent Labs    05/08/22 1756  VITAMINB12 284   Urine analysis:    Component Value Date/Time   COLORURINE YELLOW 05/09/2022 1050   APPEARANCEUR CLEAR 05/09/2022 1050   LABSPEC 1.020 05/09/2022 1050   PHURINE 6.0 05/09/2022 1050   GLUCOSEU NEGATIVE 05/09/2022 1050   HGBUR NEGATIVE 05/09/2022 1050   BILIRUBINUR NEGATIVE 05/09/2022 1050   KETONESUR NEGATIVE 05/09/2022 1050   PROTEINUR NEGATIVE 05/09/2022 1050   NITRITE NEGATIVE 05/09/2022 1050   LEUKOCYTESUR NEGATIVE 05/09/2022 1050   Sepsis Labs: @LABRCNTIP (procalcitonin:4,lacticidven:4)  )No results found for this or any previous visit (from the past 240 hour(s)).    Studies: DG CHEST PORT 1 VIEW  Result Date: 05/09/2022 CLINICAL DATA:  05/11/2022; altered mental status EXAM: PORTABLE CHEST 1 VIEW COMPARISON:  October 02, 2016 FINDINGS: The heart size and mediastinal contours are mildly enlarged, stable with stable severely ectatic thoracic aorta. Atheromatous calcifications in the arch of the aorta. Both lungs are clear. The visualized skeletal structures are unremarkable. IMPRESSION: No active disease. Electronically Signed   By: October 04, 2016 M.D.   On: 05/09/2022 08:04   MR BRAIN WO CONTRAST  Result Date: 05/08/2022 CLINICAL DATA:  Stroke suspected, Lewy body dementia, difficulty walking and lower extremity weakness. EXAM: MRI HEAD WITHOUT CONTRAST TECHNIQUE: Multiplanar, multiecho pulse sequences of the brain and surrounding structures  were obtained without intravenous contrast. COMPARISON:  04/09/2018 MRI, correlation is also made with 05/08/2022 CT head FINDINGS: Evaluation is limited as only an axial DWI, FLAIR, and motion degraded susceptibility weighted sequence were obtained before the patient attempted to remove the head coil and the study was aborted for patient safety. No restricted diffusion to suggest acute or subacute infarct. On the axial FLAIR sequence, there is confluent T2 hyperintense signal in the periventricular white matter, likely the sequela of chronic small vessel ischemic disease, which appears grossly similar to the prior exam. IMPRESSION: Limited evaluation, as described above, with no evidence acute or subacute infarct. This study could be repeated when the patient is better able to tolerate the exam, if there is concern for pathology other than acute or subacute infarct. Electronically Signed   By: 05/10/2022 M.D.   On: 05/08/2022 20:18    Scheduled Meds:  amLODipine  10 mg Oral Daily   vitamin B-12  1,000 mcg Oral Daily   donepezil  10  mg Oral QHS   enoxaparin (LOVENOX) injection  40 mg Subcutaneous Q24H   loratadine  10 mg Oral Daily   memantine  10 mg Oral BID   rosuvastatin  10 mg Oral Daily    Continuous Infusions:  sodium chloride 75 mL/hr at 05/09/22 1051     LOS: 0 days     Alma Friendly, MD [facility]  If 7PM-7AM, please contact night-coverage [email or web address] 05/09/2022, 2:36 PM
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
## 2022-05-09 NOTE — ED Notes (Signed)
Provider at bedside

## 2022-05-09 NOTE — Evaluation (Signed)
Physical Therapy Evaluation Patient Details Name: Eddie Morris MRN: [id number] DOB: Jul 18, 1951 Today's Date: 05/09/2022  History of Present Illness  Pt is a 71 y/o male who presented with gait abnormality and AMS. CT head negative for abnormalities. PMH: Lewy body dementia, HTN, CKD  Clinical Impression  Pt was seen for mobility on RW to stand and attempt gait with OT in session, and note his difficulty with need for skilled cues and directions to maneuver and work on motor rehabilitation.  He has difficulty with sequencing movement, using hands effectively with walker and with supporting on walker to try to walk.  These struggles are likely his familiarity with RW and his dementia, so will need skilled intervention with multimodal cues for returning to a more independent structure of gait.  Recommending SNF for the length of time that his set of co-morbidities will require to return to more independent balancing and walking.     Recommendations for follow up therapy are one component of a multi-disciplinary discharge planning process, led by the attending physician.  Recommendations may be updated based on patient status, additional functional criteria and insurance authorization.  Follow Up Recommendations Skilled nursing-short term rehab (<3 hours/day) Can patient physically be transported by private vehicle: No    Assistance Recommended at Discharge Frequent or constant Supervision/Assistance  Patient can return home with the following  Two people to help with walking and/or transfers;Two people to help with bathing/dressing/bathroom;Direct supervision/assist for medications management;Direct supervision/assist for financial management;Assist for transportation;Help with stairs or ramp for entrance    Equipment Recommendations None recommended by PT  Recommendations for Other Services       Functional Status Assessment Patient has had a recent decline in their functional status  and demonstrates the ability to make significant improvements in function in a reasonable and predictable amount of time.     Precautions / Restrictions Precautions Precautions: Fall Restrictions Weight Bearing Restrictions: No      Mobility  Bed Mobility Overal bed mobility: Needs Assistance Bed Mobility: Supine to Sit, Sit to Supine     Supine to sit: Mod assist, +2 for physical assistance, +2 for safety/equipment Sit to supine: Mod assist, +2 for physical assistance, +2 for safety/equipment   General bed mobility comments: pt is slow to respond to cues, and in the process of returning to bed pt continues to try to get up again despite instructions    Transfers Overall transfer level: Needs assistance Equipment used: Rolling walker (2 wheels) Transfers: Sit to/from Stand Sit to Stand: Min assist, +2 physical assistance, +2 safety/equipment           General transfer comment: pt is hand over hand for placement instructions for sequencing standing    Ambulation/Gait               General Gait Details: unable to get pt to step effectively, minimal shifting to get back to bed  Stairs            Wheelchair Mobility    Modified Rankin (Stroke Patients Only)       Balance Overall balance assessment: Needs assistance Sitting-balance support: Single extremity supported, Feet supported Sitting balance-Leahy Scale: Fair     Standing balance support: Bilateral upper extremity supported, During functional activity Standing balance-Leahy Scale: Poor                               Pertinent Vitals/Pain Pain Assessment Pain Assessment: No/denies pain  Home Living Family/patient expects to be discharged to:: Private residence Living Arrangements: Spouse/significant other Available Help at Discharge: Family Type of Home: House Home Access: Stairs to enter   [employer] of Steps: unable to specify   Home Layout: Two level Home  Equipment: Agricultural consultant (2 wheels) Additional Comments: struggling to get home details as pt is unable to recall the information    Prior Function Prior Level of Function : Needs assist;Patient poor historian/Family not available             Mobility Comments: no AD at home until just before admissoin       Hand Dominance   Dominant Hand: Right    Extremity/Trunk Assessment   Upper Extremity Assessment Upper Extremity Assessment: Defer to OT evaluation    Lower Extremity Assessment Lower Extremity Assessment: Overall WFL for tasks assessed (able to WB on legs but cannot follow mm testing cues)    Cervical / Trunk Assessment Cervical / Trunk Assessment: Normal  Communication   Communication: Expressive difficulties (slow and unclear speech)  Cognition Arousal/Alertness: Awake/alert Behavior During Therapy: Flat affect Overall Cognitive Status: History of cognitive impairments - at baseline                                 General Comments: motor control delays, inconsistencies, limited verbal information        General Comments General comments (skin integrity, edema, etc.): pt is demosntrating difficulty both following instructions and initiating verbally cued movement    Exercises     Assessment/Plan    PT Assessment Patient needs continued PT services  PT Problem List Decreased activity tolerance;Decreased balance;Decreased mobility;Decreased coordination;Decreased cognition;Decreased knowledge of use of DME;Decreased safety awareness       PT Treatment Interventions DME instruction;Gait training;Functional mobility training;Therapeutic activities;Therapeutic exercise;Balance training;Neuromuscular re-education;Patient/family education    PT Goals (Current goals can be found in the Care Plan section)  Acute Rehab PT Goals Patient Stated Goal: none stated PT Goal Formulation: Patient unable to participate in goal setting Time For Goal  Achievement: 05/23/22 Potential to Achieve Goals: Fair    Frequency Min 3X/week     Co-evaluation PT/OT/SLP Co-Evaluation/Treatment: Yes Reason for Co-Treatment: For patient/therapist safety;To address functional/ADL transfers PT goals addressed during session: Mobility/safety with mobility;Balance         AM-PAC PT "6 Clicks" Mobility  Outcome Measure Help needed turning from your back to your side while in a flat bed without using bedrails?: A Lot Help needed moving from lying on your back to sitting on the side of a flat bed without using bedrails?: Total Help needed moving to and from a bed to a chair (including a wheelchair)?: Total Help needed standing up from a chair using your arms (e.g., wheelchair or bedside chair)?: Total Help needed to walk in hospital room?: Total Help needed climbing 3-5 steps with a railing? : Total 6 Click Score: 7    End of Session Equipment Utilized During Treatment: Gait belt Activity Tolerance: Patient limited by fatigue;Treatment limited secondary to medical complications (Comment) Patient left: in bed;with call bell/phone within reach Nurse Communication: Mobility status PT Visit Diagnosis: Unsteadiness on feet (R26.81);Difficulty in walking, not elsewhere classified (R26.2)    Time: 1245-8099 PT Time Calculation (min) (ACUTE ONLY): 20 min   Charges:   PT Evaluation $PT Eval Moderate Complexity: 1 Mod         Ivar Drape 05/09/2022, 4:34 PM  Mee Hives, PT PhD Acute Rehab Dept. Number: [place] [facility] [place]

## 2022-05-09 NOTE — Evaluation (Signed)
Occupational Therapy Evaluation Patient Details Name: Eddie Morris MRN: [id number] DOB: Apr 06, 1951 Today's Date: 05/09/2022   History of Present Illness Pt is a 71 y/o male who presented with gait abnormality and AMS. CT head negative for abnormalities. PMH: Lewy body dementia, HTN, CKD   Clinical Impression   PTA, pt lives with spouse and per chart, typically Independent with ADLs/mobility without need for AD. Pt unable to confirm home setup, access to DME at home d/t poor historian at this time. Overall, pt requires Mod A x 2 for bed mobility, Min A x 2 for standing/sidesteps with RW. Pt requires Min A for UB ADL and up to Mod-Max A for LB ADLs due to deficits. Pt with slurred speech, delayed motor planning and inconsistent following of commands. Based on chart review, pt appears to be well below baseline. Rec SNF rehab based on current presentation but if pt able to mobilize more safely during admission, may be able to progress to home.       Recommendations for follow up therapy are one component of a multi-disciplinary discharge planning process, led by the attending physician.  Recommendations may be updated based on patient status, additional functional criteria and insurance authorization.   Follow Up Recommendations  Skilled nursing-short term rehab (<3 hours/day)    Assistance Recommended at Discharge Frequent or constant Supervision/Assistance  Patient can return home with the following A lot of help with walking and/or transfers;A lot of help with bathing/dressing/bathroom    Functional Status Assessment  Patient has had a recent decline in their functional status and demonstrates the ability to make significant improvements in function in a reasonable and predictable amount of time.  Equipment Recommendations  Other (comment) (TBD pending progress)    Recommendations for Other Services       Precautions / Restrictions Precautions Precautions:  Fall Restrictions Weight Bearing Restrictions: No      Mobility Bed Mobility Overal bed mobility: Needs Assistance Bed Mobility: Supine to Sit, Sit to Supine     Supine to sit: Mod assist, +2 for physical assistance, HOB elevated Sit to supine: Mod assist, +2 for physical assistance, HOB elevated   General bed mobility comments: max multimodal cues to initiate task with assist to advance LEs and lift trunk. when attempting to reposition back on stretcher, pt kept swinging LEs off of stretcher with difficulty redirecting to start with    Transfers Overall transfer level: Needs assistance Equipment used: Rolling walker (2 wheels) Transfers: Sit to/from Stand Sit to Stand: Min assist, +2 physical assistance, From elevated surface           General transfer comment: cues of hand placement, able to lift bottom without significant assist but did need assist to achieve full upright posture, noted shakiness      Balance Overall balance assessment: Needs assistance Sitting-balance support: No upper extremity supported, Feet supported Sitting balance-Leahy Scale: Fair     Standing balance support: Bilateral upper extremity supported, During functional activity Standing balance-Leahy Scale: Poor                             ADL either performed or assessed with clinical judgement   ADL Overall ADL's : Needs assistance/impaired Eating/Feeding: Set up;Sitting   Grooming: Supervision/safety;Sitting   Upper Body Bathing: Minimal assistance;Sitting   Lower Body Bathing: Moderate assistance;Sit to/from stand   Upper Body Dressing : Sitting;Minimal assistance   Lower Body Dressing: Sit to/from stand;Sitting/lateral leans;Maximal assistance  Toileting- Clothing Manipulation and Hygiene: Maximal assistance;Sit to/from stand         General ADL Comments: Pt with fairly decent strength though poor balance, impaired coordination and increased confusion requiring  increased assist for ADLs     Vision Baseline Vision/History: 1 Wears glasses Ability to See in Adequate Light: 0 Adequate Patient Visual Report: No change from baseline Vision Assessment?: No apparent visual deficits     Perception     Praxis      Pertinent Vitals/Pain Pain Assessment Pain Assessment: No/denies pain     Hand Dominance Right   Extremity/Trunk Assessment Upper Extremity Assessment Upper Extremity Assessment: Overall WFL for tasks assessed   Lower Extremity Assessment Lower Extremity Assessment: Defer to PT evaluation   Cervical / Trunk Assessment Cervical / Trunk Assessment: Normal   Communication Communication Communication: Expressive difficulties;Other (comment) (slurred speech at times)   Cognition Arousal/Alertness: Awake/alert Behavior During Therapy: Flat affect Overall Cognitive Status: History of cognitive impairments - at baseline                                 General Comments: hx of dementia. inconsistent following of commands, delayed motor planning and memory deficits evident. pt unable to report why he was at hospital, the month, etc.     General Comments       Exercises     Shoulder Instructions      Home Living Family/patient expects to be discharged to:: Private residence Living Arrangements: Spouse/significant other Available Help at Discharge: Family Type of Home: House Home Access: Stairs to enter [employer] of Steps: unable to specify   Home Layout: Two level               [employer]: Conservation officer, nature (2 wheels)   Additional Comments: Pt with difficulty recalling DME, home setup, if bedroom on first floor vs second. no family present      Prior Functioning/Environment Prior Level of Function : Needs assist;Patient poor historian/Family not available             Mobility Comments: typically no AD needed for mobility per chart. had been using RW leading up to presentation at  hospital d/t difficulty mobilizing ADLs Comments: Per chart, pt able to bathe, dress self without difficulty        OT Problem List: Decreased strength;Decreased activity tolerance;Impaired balance (sitting and/or standing);Decreased cognition;Decreased coordination;Decreased safety awareness;Decreased knowledge of use of DME or AE;Decreased knowledge of precautions      OT Treatment/Interventions: Self-care/ADL training;Therapeutic exercise;Energy conservation;DME and/or AE instruction;Therapeutic activities;Patient/family education    OT Goals(Current goals can be found in the care plan section) Acute Rehab OT Goals Patient Stated Goal: agreeable to stand with therapies OT Goal Formulation: With patient Time For Goal Achievement: 05/23/22 Potential to Achieve Goals: Good  OT Frequency: Min 2X/week    Co-evaluation PT/OT/SLP Co-Evaluation/Treatment: Yes Reason for Co-Treatment: For patient/therapist safety;To address functional/ADL transfers   OT goals addressed during session: ADL's and self-care;Strengthening/ROM      AM-PAC OT "6 Clicks" Daily Activity     Outcome Measure Help from another person eating meals?: A Little Help from another person taking care of personal grooming?: A Little Help from another person toileting, which includes using toliet, bedpan, or urinal?: A Lot Help from another person bathing (including washing, rinsing, drying)?: A Lot Help from another person to put on and taking off regular upper body clothing?: A Little Help  from another person to put on and taking off regular lower body clothing?: A Lot 6 Click Score: 15   End of Session Equipment Utilized During Treatment: Gait belt;Rolling walker (2 wheels) Nurse Communication: Mobility status  Activity Tolerance: Patient tolerated treatment well Patient left: in bed;with call bell/phone within reach  OT Visit Diagnosis: Unsteadiness on feet (R26.81);Other abnormalities of gait and mobility  (R26.89)                Time: 2836-6294 OT Time Calculation (min): 27 min Charges:  OT General Charges $OT Visit: 1 Visit OT Evaluation $OT Eval Moderate Complexity: 1 Mod  Bradd Canary, OTR/L Acute Rehab Services Office: [phone]   Lorre Munroe 05/09/2022, 12:32 PM

## 2022-05-09 NOTE — Plan of Care (Signed)
  Problem: Education: Goal: Knowledge of General Education information will improve Description Including pain rating scale, medication(s)/side effects and non-pharmacologic comfort measures Outcome: Progressing   Problem: Health Behavior/Discharge Planning: Goal: Ability to manage health-related needs will improve Outcome: Progressing   

## 2022-05-10 DIAGNOSIS — E7800 Pure hypercholesterolemia, unspecified: Secondary | ICD-10-CM | POA: Diagnosis not present

## 2022-05-10 DIAGNOSIS — M6281 Muscle weakness (generalized): Secondary | ICD-10-CM | POA: Diagnosis not present

## 2022-05-10 DIAGNOSIS — N189 Chronic kidney disease, unspecified: Secondary | ICD-10-CM | POA: Diagnosis not present

## 2022-05-10 DIAGNOSIS — R2681 Unsteadiness on feet: Secondary | ICD-10-CM | POA: Diagnosis not present

## 2022-05-10 DIAGNOSIS — R260 Ataxic gait: Secondary | ICD-10-CM | POA: Diagnosis not present

## 2022-05-10 DIAGNOSIS — F0280 Dementia in other diseases classified elsewhere without behavioral disturbance: Secondary | ICD-10-CM | POA: Diagnosis not present

## 2022-05-10 DIAGNOSIS — F17210 Nicotine dependence, cigarettes, uncomplicated: Secondary | ICD-10-CM | POA: Diagnosis not present

## 2022-05-10 DIAGNOSIS — Z7401 Bed confinement status: Secondary | ICD-10-CM | POA: Diagnosis not present

## 2022-05-10 DIAGNOSIS — I10 Essential (primary) hypertension: Secondary | ICD-10-CM | POA: Diagnosis not present

## 2022-05-10 DIAGNOSIS — F0671 Mild neurocognitive disorder due to known physiological condition with behavioral disturbance: Secondary | ICD-10-CM | POA: Diagnosis not present

## 2022-05-10 DIAGNOSIS — F03B18 Unspecified dementia, moderate, with other behavioral disturbance: Secondary | ICD-10-CM | POA: Diagnosis not present

## 2022-05-10 DIAGNOSIS — G3183 Dementia with Lewy bodies: Secondary | ICD-10-CM | POA: Diagnosis not present

## 2022-05-10 DIAGNOSIS — F03918 Unspecified dementia, unspecified severity, with other behavioral disturbance: Secondary | ICD-10-CM | POA: Diagnosis not present

## 2022-05-10 DIAGNOSIS — F02B18 Dementia in other diseases classified elsewhere, moderate, with other behavioral disturbance: Secondary | ICD-10-CM | POA: Diagnosis not present

## 2022-05-10 DIAGNOSIS — Z91040 Latex allergy status: Secondary | ICD-10-CM | POA: Diagnosis not present

## 2022-05-10 DIAGNOSIS — D519 Vitamin B12 deficiency anemia, unspecified: Secondary | ICD-10-CM | POA: Diagnosis not present

## 2022-05-10 DIAGNOSIS — R262 Difficulty in walking, not elsewhere classified: Secondary | ICD-10-CM | POA: Diagnosis not present

## 2022-05-10 DIAGNOSIS — R531 Weakness: Secondary | ICD-10-CM | POA: Diagnosis not present

## 2022-05-10 DIAGNOSIS — Z79899 Other long term (current) drug therapy: Secondary | ICD-10-CM | POA: Diagnosis not present

## 2022-05-10 DIAGNOSIS — I129 Hypertensive chronic kidney disease with stage 1 through stage 4 chronic kidney disease, or unspecified chronic kidney disease: Secondary | ICD-10-CM | POA: Diagnosis not present

## 2022-05-10 DIAGNOSIS — F0390 Unspecified dementia without behavioral disturbance: Secondary | ICD-10-CM | POA: Diagnosis not present

## 2022-05-10 DIAGNOSIS — Z720 Tobacco use: Secondary | ICD-10-CM | POA: Diagnosis not present

## 2022-05-10 DIAGNOSIS — R4182 Altered mental status, unspecified: Secondary | ICD-10-CM | POA: Diagnosis not present

## 2022-05-10 DIAGNOSIS — G309 Alzheimer's disease, unspecified: Secondary | ICD-10-CM | POA: Diagnosis not present

## 2022-05-10 DIAGNOSIS — R7402 Elevation of levels of lactic acid dehydrogenase (LDH): Secondary | ICD-10-CM | POA: Diagnosis not present

## 2022-05-10 DIAGNOSIS — N1831 Chronic kidney disease, stage 3a: Secondary | ICD-10-CM | POA: Diagnosis not present

## 2022-05-10 LAB — CBC WITH DIFFERENTIAL/PLATELET
Abs Immature Granulocytes: 0 10*3/uL (ref 0.00–0.07)
Basophils Absolute: 0 10*3/uL (ref 0.0–0.1)
Basophils Relative: 1 %
Eosinophils Absolute: 0 10*3/uL (ref 0.0–0.5)
Eosinophils Relative: 0 %
HCT: 42.7 % (ref 39.0–52.0)
Hemoglobin: 14.3 g/dL (ref 13.0–17.0)
Immature Granulocytes: 0 %
Lymphocytes Relative: 26 %
Lymphs Abs: 1.5 10*3/uL (ref 0.7–4.0)
MCH: 31.3 pg (ref 26.0–34.0)
MCHC: 33.5 g/dL (ref 30.0–36.0)
MCV: 93.4 fL (ref 80.0–100.0)
Monocytes Absolute: 1.1 10*3/uL — ABNORMAL HIGH (ref 0.1–1.0)
Monocytes Relative: 19 %
Neutro Abs: 3.2 10*3/uL (ref 1.7–7.7)
Neutrophils Relative %: 54 %
Platelets: 144 10*3/uL — ABNORMAL LOW (ref 150–400)
RBC: 4.57 MIL/uL (ref 4.22–5.81)
RDW: 12.7 % (ref 11.5–15.5)
WBC: 5.9 10*3/uL (ref 4.0–10.5)
nRBC: 0 % (ref 0.0–0.2)

## 2022-05-10 LAB — BASIC METABOLIC PANEL
Anion gap: 11 (ref 5–15)
BUN: 12 mg/dL (ref 8–23)
CO2: 21 mmol/L — ABNORMAL LOW (ref 22–32)
Calcium: 8.3 mg/dL — ABNORMAL LOW (ref 8.9–10.3)
Chloride: 106 mmol/L (ref 98–111)
Creatinine, Ser: 1.17 mg/dL (ref 0.61–1.24)
GFR, Estimated: >60 mL/min (ref >60)
Glucose, Bld: 98 mg/dL (ref 70–99)
Potassium: 3.3 mmol/L — ABNORMAL LOW (ref 3.5–5.1)
Sodium: 138 mmol/L (ref 135–145)

## 2022-05-10 MED ORDER — CYANOCOBALAMIN 1000 MCG PO TABS: 1000.0000 ug | ORAL_TABLET | Freq: Every day | ORAL | 0 refills | Status: AC

## 2022-05-10 MED ORDER — POTASSIUM CHLORIDE CRYS ER 20 MEQ PO TBCR
40.0000 meq | EXTENDED_RELEASE_TABLET | Freq: Once | ORAL | Status: AC
  Administered 2022-05-10: 40 meq via ORAL
  Filled 2022-05-10: qty 2

## 2022-05-10 NOTE — Plan of Care (Signed)
Discharged to long term care

## 2022-05-10 NOTE — Discharge Summary (Signed)
Physician Discharge Summary   Patient: Eddie Morris MRN: [id number] DOB: 08-Apr-1951  Admit date:     05/08/2022  Discharge date: 05/10/22  Discharge Physician: Alma Friendly   PCP: Seward Carol, MD   Recommendations at discharge:   Follow-up with PCP  Discharge Diagnoses: Principal Problem:   Gait instability Active Problems:   Chronic kidney disease, stage 3a (HCC)   Dementia with behavioral disturbance (HCC)   Essential hypertension   Pure hypercholesterolemia   Tobacco user    Hospital Course: Edwyn Inclan is a 71 y.o. male with medical history significant of dementia, HTN, HLD, CKD 3a who presented to ED with gait abnormality, staring spells with eye twitching, inability to perform his ADLs as he would. Noted significant decline recently with some disorientation. In the ED, creatinine: 1.37, Ct head: no acute findings, neurology consulted, [facility] asked to admit.     Today, met with patient's wife at bedside, she reported patient is back to baseline.  Patient seemed talkative, denied any new complaints.  Patient stable to discharge to SNF for further rehab needs.     Assessment and Plan:  Gait instability Presenting with bilateral LE weakness, gait instability and some change from his cognitive baseline CT head with no acute findings MRI brain with limited evaluation, with no evidence of acute or subacute infarct Neurology consulted, no further recs PT/OT rec SNF   AMS Moderate dementia Likely 2/2 progressive decline Vs dehydration CVA ruled out Infection r/o: UA neg, CXR unremarkable TSH WNL, Vit B12-->284 Started on vitamin B12   Elevated lactic acid Mildly elevated at 2.2, trended back to normal Likely 2/2 poor oral intake S/p IVF   Chronic kidney disease, stage 3a ([place]) Baseline from PCP office appears to be 1.2-1.4 Stable   Tobacco user Advised to quit Declines nicotine patch    Pure hypercholesterolemia Continue crestor 55m  daily    Essential hypertension Stable Continue 111mnorvasc daily    Dementia with behavioral disturbance (HCC) Continue aricept and namenda      Consultants: None Procedures performed: None Disposition: Skilled nursing facility Diet recommendation:  Cardiac diet   DISCHARGE MEDICATION: Allergies as of 05/10/2022       Reactions   Latex Rash        Medication List     TAKE these medications    amLODipine 10 MG tablet Commonly known as: NORVASC Take 10 mg by mouth daily.   cetirizine 10 MG tablet Commonly known as: ZYRTEC Take 10 mg by mouth at bedtime.   cyanocobalamin 1000 MCG tablet Take 1 tablet (1,000 mcg total) by mouth daily. Start taking on: May 11, 2022   donepezil 10 MG tablet Commonly known as: ARICEPT Take 10 mg by mouth at bedtime.   memantine 10 MG tablet Commonly known as: NAMENDA Take 10 mg by mouth 2 (two) times daily.   rosuvastatin 10 MG tablet Commonly known as: CRESTOR Take 10 mg by mouth daily.        Contact information for follow-up providers     [facility] CarolMD Follow up.   Specialty: Internal Medicine Contact information: [address] [place] [phone]            Contact information for after-discharge care     Destination     [facility] Preferred SNF .   Service: Skilled Nursing Contact information: [phone]  Discharge Exam: Filed Weights   05/08/22 1227  Weight: 79.4 kg   General: NAD  Cardiovascular: S1, S2 present Respiratory: CTAB Abdomen: Soft, nontender, nondistended, bowel sounds present Musculoskeletal: No bilateral pedal edema noted Skin: Normal Psychiatry: Normal mood   Condition at discharge: stable  The results of significant diagnostics from this hospitalization (including imaging, microbiology, ancillary and laboratory) are listed below for  reference.   Imaging Studies: DG CHEST PORT 1 VIEW  Result Date: 05/09/2022 CLINICAL DATA:  [id number]; altered mental status EXAM: PORTABLE CHEST 1 VIEW COMPARISON:  October 02, 2016 FINDINGS: The heart size and mediastinal contours are mildly enlarged, stable with stable severely ectatic thoracic aorta. Atheromatous calcifications in the arch of the aorta. Both lungs are clear. The visualized skeletal structures are unremarkable. IMPRESSION: No active disease. Electronically Signed   By: Frazier Richards M.D.   On: 05/09/2022 08:04   MR BRAIN WO CONTRAST  Result Date: 05/08/2022 CLINICAL DATA:  Stroke suspected, Lewy body dementia, difficulty walking and lower extremity weakness. EXAM: MRI HEAD WITHOUT CONTRAST TECHNIQUE: Multiplanar, multiecho pulse sequences of the brain and surrounding structures were obtained without intravenous contrast. COMPARISON:  04/09/2018 MRI, correlation is also made with 05/08/2022 CT head FINDINGS: Evaluation is limited as only an axial DWI, FLAIR, and motion degraded susceptibility weighted sequence were obtained before the patient attempted to remove the head coil and the study was aborted for patient safety. No restricted diffusion to suggest acute or subacute infarct. On the axial FLAIR sequence, there is confluent T2 hyperintense signal in the periventricular white matter, likely the sequela of chronic small vessel ischemic disease, which appears grossly similar to the prior exam. IMPRESSION: Limited evaluation, as described above, with no evidence acute or subacute infarct. This study could be repeated when the patient is better able to tolerate the exam, if there is concern for pathology other than acute or subacute infarct. Electronically Signed   By: Merilyn Baba M.D.   On: 05/08/2022 20:18   CT HEAD WO CONTRAST  Result Date: 05/08/2022 CLINICAL DATA:  Headache, new or worsening. Eye twitching unsteady gait. EXAM: CT HEAD WITHOUT CONTRAST TECHNIQUE: Contiguous axial  images were obtained from the base of the skull through the vertex without intravenous contrast. RADIATION DOSE REDUCTION: This exam was performed according to the departmental dose-optimization program which includes automated exposure control, adjustment of the mA and/or kV according to patient size and/or use of iterative reconstruction technique. COMPARISON:  CT examination dated October 03, 2016. FINDINGS: Brain: No evidence of acute infarction, hemorrhage, hydrocephalus, extra-axial collection or mass lesion/mass effect. Mild cerebral volume loss and low-attenuation of the periventricular white matter presumed chronic microvascular ischemic changes. Vascular: No hyperdense vessel or unexpected calcification. Skull: Normal. Negative for fracture or focal lesion. Sinuses/Orbits: No acute finding. Other: None. IMPRESSION: 1.  No acute intracranial abnormality. 2. Mild cerebral volume loss and chronic microvascular ischemic changes of the white matter, not significantly changed. Electronically Signed   By: Keane Police D.O.   On: 05/08/2022 13:00    Microbiology: Results for orders placed or performed during the hospital encounter of 05/08/22  Urine Culture     Status: Abnormal (Preliminary result)   Collection Time: 05/09/22 11:21 AM   Specimen: Urine, Clean Catch  Result Value Ref Range Status   Specimen Description URINE, CLEAN CATCH  Final   Special Requests NONE  Final   Culture (A)  Final    20,000 COLONIES/mL CITROBACTER KOSERI SUSCEPTIBILITIES TO FOLLOW Performed at [facility],  [address], [place], [place] [address]    Report Status PENDING  Incomplete    Labs: CBC: Recent Labs  Lab 05/08/22 1229 05/08/22 1250 05/09/22 0826 05/10/22 0322  WBC 7.2  --  7.6 5.9  NEUTROABS 5.7  --   --  3.2  HGB 14.5 15.0 15.6 14.3  HCT 42.3 44.0 47.7 42.7  MCV 93.4  --  97.5 93.4  PLT 211  --  149* 833*   Basic Metabolic Panel: Recent Labs  Lab 05/08/22 1229 05/08/22 1250  05/09/22 0826 05/10/22 0322  NA 139 140 138 138  K 3.8 3.6 3.8 3.3*  CL 107 107 105 106  CO2 22  --  21* 21*  GLUCOSE 88 90 81 98  BUN 8 9 7* 12  CREATININE 1.37* 1.30* 1.15 1.17  CALCIUM 9.2  --  8.6* 8.3*   Liver Function Tests: Recent Labs  Lab 05/08/22 1229  AST 20  ALT 10  ALKPHOS 56  BILITOT 0.9  PROT 7.1  ALBUMIN 4.2   CBG: No results for input(s): "GLUCAP" in the last 168 hours.  Discharge time spent: less than 30 minutes.  Signed: Alma Friendly, MD [facility] 05/10/2022

## 2022-05-10 NOTE — Progress Notes (Signed)
Occupational Therapy Treatment Patient Details Name: Eddie Morris MRN: [id number] DOB: 1950-09-29 Today's Date: 05/10/2022   History of present illness Pt is a 71 y/o male who presented with gait abnormality and AMS. CT head negative for abnormalities. PMH: Lewy body dementia, HTN, CKD   OT comments  Pt making good progress with adls and adl mobility.  Pt now at min assist level with min to mod verbal cues to stay on task.  Pt walked to bathroom and sink with min assist and slight posterior lean.  Cont to feel pt would benefit from rehab prior to going home with wife due to being more of a fall risk than before admit.  Pt is progressing quickly and if not d/c soon, may be able to achieve this level with a few more days of therapy.  Will continue to see with focus on fall prevention.  Pt will need wife home with him 24/7 if pt is to d/c home.    Recommendations for follow up therapy are one component of a multi-disciplinary discharge planning process, led by the attending physician.  Recommendations may be updated based on patient status, additional functional criteria and insurance authorization.    Follow Up Recommendations  Skilled nursing-short term rehab (<3 hours/day)    Assistance Recommended at Discharge Frequent or constant Supervision/Assistance  Patient can return home with the following  A little help with walking and/or transfers;A little help with bathing/dressing/bathroom;Assistance with cooking/housework;Direct supervision/assist for medications management;Direct supervision/assist for financial management;Assist for transportation;Help with stairs or ramp for entrance   Equipment Recommendations  Other (comment) (tbd)    Recommendations for Other Services      Precautions / Restrictions Precautions Precautions: Fall Restrictions Weight Bearing Restrictions: No       Mobility Bed Mobility Overal bed mobility: Needs Assistance Bed Mobility: Supine to Sit, Sit  to Supine     Supine to sit: Min assist Sit to supine: Min guard   General bed mobility comments: Pt much improved with bed mobility requiring very little physical assist but does need cues for sequencing.    Transfers Overall transfer level: Needs assistance Equipment used: 1 person hand held assist Transfers: Sit to/from Stand, Bed to chair/wheelchair/BSC Sit to Stand: Min assist Stand pivot transfers: Min assist         General transfer comment: Cues for hand placment and safety     Balance Overall balance assessment: Needs assistance Sitting-balance support: Feet supported Sitting balance-Leahy Scale: Good     Standing balance support: Bilateral upper extremity supported, During functional activity Standing balance-Leahy Scale: Fair Standing balance comment: Pt stood at sink for 6 minutes. At times, pt could stand for 1-2 minutes without assist but would then get very mild posterior lean and require min assist to shift weight to balls of feet. When given a walker, pt struggled most likely because not accustomed to using one.                           ADL either performed or assessed with clinical judgement   ADL Overall ADL's : Needs assistance/impaired Eating/Feeding: Set up;Sitting   Grooming: Min guard;Standing Grooming Details (indicate cue type and reason): Pt stood at sink for appx 6 minutes with mild posterior lean requiring min assist at times to shift weight to balls of feet. Upper Body Bathing: Supervision/ safety;Sitting   Lower Body Bathing: Minimal assistance;Sit to/from stand;Cueing for safety   Upper Body Dressing : Supervision/safety;Sitting;Cueing for sequencing  Lower Body Dressing: Moderate assistance;Sitting/lateral leans;Cueing for compensatory techniques Lower Body Dressing Details (indicate cue type and reason): Pt donned socks and shoes today with VCs only. Pt required assist to donn pants and mod assist o stand and pull pants up  on first attempt.  Min assist on second attempt. Toilet Transfer: Minimal assistance;Ambulation;Comfort height toilet;Grab bars Toilet Transfer Details (indicate cue type and reason): Pt walked to bathroom with hand held assist. Toileting- Clothing Manipulation and Hygiene: Moderate assistance;Sit to/from stand;Cueing for safety       Functional mobility during ADLs: Minimal assistance General ADL Comments: Pt's balance improved from first session but still not at supervision level. Pt with difficult time expressing himself and confused at times.    Extremity/Trunk Assessment Upper Extremity Assessment Upper Extremity Assessment: Overall WFL for tasks assessed   Lower Extremity Assessment Lower Extremity Assessment: Defer to PT evaluation        Vision   Vision Assessment?: No apparent visual deficits   Perception Perception Perception: Within Functional Limits   Praxis Praxis Praxis: Not tested    Cognition Arousal/Alertness: Awake/alert Behavior During Therapy: Flat affect Overall Cognitive Status: History of cognitive impairments - at baseline                                 General Comments: Pt limited verbally, presented with some paranoias and generally confused.  Unsure of exact baseline. Pt will have to have 24/7 assist at home for all mobility and self care at this time due to poor balance.  Feel pt can get to more of a min assist/supervision level but is not there yet.        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  Would like to confirm with family    Pertinent Vitals/ Pain       Pain Assessment Pain Assessment: No/denies pain  Home Living                                          Prior Functioning/Environment              Frequency  Min 2X/week        Progress Toward Goals  OT Goals(current goals can now be found in the care plan section)  Progress  towards OT goals: Progressing toward goals  Acute Rehab OT Goals Patient Stated Goal: to go home OT Goal Formulation: With patient Time For Goal Achievement: 05/23/22 Potential to Achieve Goals: Good ADL Goals Pt Will Perform Lower Body Bathing: with supervision;sit to/from stand Pt Will Perform Lower Body Dressing: with supervision;sitting/lateral leans;sit to/from stand Pt Will Transfer to Toilet: with min guard assist;ambulating  Plan Discharge plan remains appropriate    Co-evaluation                 AM-PAC OT "6 Clicks" Daily Activity     Outcome Measure   Help from another person eating meals?: A Little Help from another person taking care of personal grooming?: A Little Help from another person toileting, which includes using toliet, bedpan, or urinal?: A Little Help from another person bathing (including washing, rinsing, drying)?: A Little Help from another person to put on and taking off regular upper body clothing?: A Little Help  from another person to put on and taking off regular lower body clothing?: A Little 6 Click Score: 18    End of Session Equipment Utilized During Treatment: Gait belt  OT Visit Diagnosis: Unsteadiness on feet (R26.81);Other abnormalities of gait and mobility (R26.89)   Activity Tolerance Patient tolerated treatment well   Patient Left in bed;with call bell/phone within reach   Nurse Communication Mobility status        Time: 2947-6546 OT Time Calculation (min): 23 min  Charges: OT General Charges $OT Visit: 1 Visit OT Treatments $Self Care/Home Management : 23-37 mins   Hope Budds 05/10/2022, 1:29 PM

## 2022-05-10 NOTE — TOC Transition Note (Signed)
Transition of Care [facility]) - CM/SW Discharge Note   Patient Details  Name: Eddie Morris MRN: [id number] Date of Birth: 04/06/1951  Transition of Care [facility]) CM/SW Contact:  Carley Hammed, LCSWA Phone Number: 05/10/2022, 2:24 PM   Clinical Narrative:    Pt to be transported to [facility] via [place]. Nurse to call report to [phone].   Final next level of care: Skilled Nursing Facility Barriers to Discharge: Barriers Resolved   Patient Goals and CMS Choice Patient states their goals for this hospitalization and ongoing recovery are:: Pt disoriented and unable to participate in goal setting. CMS Medicare.gov Compare Post Acute Care list provided to:: Patient Represenative (must comment) (spouse) Choice offered to / list presented to : Spouse  Discharge Placement              Patient [facility] at: [facility] Patient to be transferred to facility by: [facility] Name of family member notified: Debbie Patient and family notified of of transfer: 05/10/22  Discharge Plan and Services     Post Acute Care Choice: Skilled Nursing Facility                               Social Determinants of Health (SDOH) Interventions     Readmission Risk Interventions     No data to display

## 2022-05-10 NOTE — NC FL2 (Signed)
[place] MEDICAID FL2 LEVEL OF CARE SCREENING TOOL     IDENTIFICATION  Patient Name: Eddie Morris Birthdate: 31-Jan-1951 Sex: male Admission Date (Current Location): 05/08/2022  [facility] and [place] Number:  Producer, television/film/video and Address:  [facility] [place] [address], [place], [place] [address]      Provider Number: [id number]  Attending Physician Name and Address:  Briant Cedar, MD  Relative Name and Phone Number:  Anthonie, Lot[place] [phone]    Current Level of Care: Hospital Recommended Level of Care: Skilled Nursing Facility Prior Approval Number:    Date Approved/Denied:   PASRR Number: [id number]  Discharge Plan: SNF    Current Diagnoses: Patient Active Problem List   Diagnosis Date Noted   Chronic kidney disease, stage 3a (HCC) 05/08/2022   Dementia with behavioral disturbance (HCC) 05/08/2022   Essential hypertension 05/08/2022   Pure hypercholesterolemia 05/08/2022   Tobacco user 05/08/2022   Gait instability 05/08/2022    Orientation RESPIRATION BLADDER Height & Weight     Self  Normal Incontinent, External catheter Weight: 175 lb (79.4 kg) Height:  6' (182.9 cm)  BEHAVIORAL SYMPTOMS/MOOD NEUROLOGICAL BOWEL NUTRITION STATUS      Continent Diet (See discharge summary.)  AMBULATORY STATUS COMMUNICATION OF NEEDS Skin   Extensive Assist Verbally Normal                       Personal Care Assistance Level of Assistance  Bathing, Dressing, Feeding Bathing Assistance: Maximum assistance Feeding assistance: Maximum assistance Dressing Assistance: Maximum assistance     Functional Limitations Info  Sight, Hearing, Speech Sight Info: Impaired Hearing Info: Adequate Speech Info: Adequate    SPECIAL CARE FACTORS FREQUENCY  PT (By licensed PT), OT (By licensed OT)     PT Frequency: 5x/week OT Frequency: 5x/week            Contractures Contractures Info: Not present    Additional Factors Info   Code Status, Allergies Code Status Info: Full code Allergies Info: Latex           Current Medications (05/10/2022):  This is the current hospital active medication list Current Facility-Administered Medications  Medication Dose Route Frequency Provider Last Rate Last Admin   acetaminophen (TYLENOL) tablet 650 mg  650 mg Oral Q6H PRN Orland Mustard, MD   650 mg at 05/09/22 1832   Or   acetaminophen (TYLENOL) suppository 650 mg  650 mg Rectal Q6H PRN Orland Mustard, MD       amLODipine (NORVASC) tablet 10 mg  10 mg Oral Daily Orland Mustard, MD   10 mg at 05/10/22 8413   cyanocobalamin (VITAMIN B12) tablet 1,000 mcg  1,000 mcg Oral Daily Briant Cedar, MD   1,000 mcg at 05/10/22 0930   donepezil (ARICEPT) tablet 10 mg  10 mg Oral QHS Orland Mustard, MD   10 mg at 05/09/22 2145   enoxaparin (LOVENOX) injection 40 mg  40 mg Subcutaneous Q24H Orland Mustard, MD   40 mg at 05/09/22 1734   loratadine (CLARITIN) tablet 10 mg  10 mg Oral Daily Orland Mustard, MD   10 mg at 05/10/22 0929   memantine (NAMENDA) tablet 10 mg  10 mg Oral BID Orland Mustard, MD   10 mg at 05/10/22 0930   potassium chloride SA (KLOR-CON M) CR tablet 40 mEq  40 mEq Oral Once Briant Cedar, MD       rosuvastatin (CRESTOR) tablet 10 mg  10 mg Oral  Daily Orland Mustard, MD   10 mg at 05/10/22 0930     Discharge Medications: Please see discharge summary for a list of discharge medications.  Relevant Imaging Results:  Relevant Lab Results:   Additional Information SSN: [id number]  Carley Hammed, [place]

## 2022-05-10 NOTE — TOC Initial Note (Signed)
Transition of Care [facility]) - Initial/Assessment Note    Patient Details  Name: Eddie Morris MRN: [id number] Date of Birth: 1951-08-18  Transition [facility] Care [facility]) CM/SW Contact:    Carley Hammed, LCSWA Phone Number: 05/10/2022, 12:09 PM  Clinical Narrative:                 CSW spoke with pt's spouse who noted pt generally lives with her and does not need a lot of assistance. After reviewing PT note, spouse is agreeable to SNF. Spouse notes that her mother is going to [facility] today and would like for them both to be in the same place. The plan is for pt to return home after rehab. CSW will send referral and contact facility. TOC will continue to follow for DC needs.  Expected Discharge Plan: Skilled Nursing Facility Barriers to Discharge: SNF Pending bed offer, Insurance Authorization, Continued Medical Work up   Patient Goals and CMS Choice Patient states their goals for this hospitalization and ongoing recovery are:: Pt disoriented and unable to participate in goal setting. CMS Medicare.gov Compare Post Acute Care list provided to:: Patient Represenative (must comment) (spouse) Choice offered to / list presented to : Spouse  Expected Discharge Plan and Services Expected Discharge Plan: Skilled Nursing Facility     Post Acute Care Choice: Skilled Nursing Facility Living arrangements for the past 2 months: Single Family Home                                      Prior Living Arrangements/Services Living arrangements for the past 2 months: Single Family Home Lives with:: Self Patient language and need for interpreter reviewed:: Yes Do you feel safe going back to the place where you live?: Yes      Need for Family Participation in Patient Care: Yes (Comment) Care giver support system in place?: Yes (comment)   Criminal Activity/Legal Involvement Pertinent to Current Situation/Hospitalization: No - Comment as needed  Activities of Daily Living       Permission Sought/Granted Permission sought to share information with : Family Supports Permission granted to share information with : Yes, Verbal Permission Granted  Share Information with NAME: Chadric Kimberley     Permission granted to share info w Relationship: Spouse  Permission granted to share info w Contact Information: [phone]  Emotional Assessment Appearance:: Appears stated age Attitude/Demeanor/Rapport: Unable to Assess Affect (typically observed): Unable to Assess Orientation: : Oriented to Self Alcohol / Substance Use: Not Applicable Psych Involvement: No (comment)  Admission diagnosis:  Gait instability [R26.81] Patient Active Problem List   Diagnosis Date Noted   Chronic kidney disease, stage 3a (HCC) 05/08/2022   Dementia with behavioral disturbance (HCC) 05/08/2022   Essential hypertension 05/08/2022   Pure hypercholesterolemia 05/08/2022   Tobacco user 05/08/2022   Gait instability 05/08/2022   PCP:  Renford Dills, MD Pharmacy:   [phone] - [place], [place] - [address] AT [facility] [address] [place] [address] Phone: [phone] Fax: [phone]     Social Determinants of Health (SDOH) Interventions    Readmission Risk Interventions     No data to display

## 2022-05-10 NOTE — Plan of Care (Signed)
?  Problem: Education: ?Goal: Knowledge of General Education information will improve ?Description: Including pain rating scale, medication(s)/side effects and non-pharmacologic comfort measures ?Outcome: Progressing ?  ?Problem: Clinical Measurements: ?Goal: Ability to maintain clinical measurements within normal limits will improve ?Outcome: Progressing ?  ?Problem: Nutrition: ?Goal: Adequate nutrition will be maintained ?Outcome: Progressing ?  ?Problem: Coping: ?Goal: Level of anxiety will decrease ?Outcome: Progressing ?  ?Problem: Elimination: ?Goal: Will not experience complications related to bowel motility ?Outcome: Progressing ?  ?

## 2022-05-10 NOTE — Progress Notes (Signed)
Physical Therapy Treatment Patient Details Name: Eddie Morris MRN: [id number] DOB: 07-31-1951 Today's Date: 05/10/2022   History of Present Illness Pt is a 71 y/o male who presented with gait abnormality and AMS. CT head negative for abnormalities. PMH: Lewy body dementia, HTN, CKD    PT Comments    Pt oriented to self only, pt mostly pleasant but has periods of paranoia where he states "my wife thinks it's funny, she is laughing at me" when wife is not present and is from all accounts very supportive of pt. Pt still requiring physical assist for mobility and use of RW for mobility, when at baseline pt is ambulatory without a device and mobilizes independently. PT still recommending ST-SNF at this time.      Recommendations for follow up therapy are one component of a multi-disciplinary discharge planning process, led by the attending physician.  Recommendations may be updated based on patient status, additional functional criteria and insurance authorization.  Follow Up Recommendations  Skilled nursing-short term rehab (<3 hours/day) Can patient physically be transported by private vehicle: Yes   Assistance Recommended at Discharge Frequent or constant Supervision/Assistance  Patient can return home with the following Two people to help with walking and/or transfers;Two people to help with bathing/dressing/bathroom;Direct supervision/assist for medications management;Direct supervision/assist for financial management;Assist for transportation;Help with stairs or ramp for entrance   Equipment Recommendations  None recommended by PT    Recommendations for Other Services       Precautions / Restrictions Precautions Precautions: Fall Restrictions Weight Bearing Restrictions: No     Mobility  Bed Mobility Overal bed mobility: Needs Assistance Bed Mobility: Supine to Sit, Sit to Supine     Supine to sit: Mod assist Sit to supine: Min guard   General bed mobility  comments: assist for LE progression to EOB, very increased time to scoot to EOB with UE tremors and multiple stop/starts.    Transfers Overall transfer level: Needs assistance Equipment used: Rolling walker (2 wheels) Transfers: Sit to/from Stand Sit to Stand: Mod assist, +2 physical assistance, Min assist           General transfer comment: initially mod +2 for power up, rise, steadying. With second trial, min assist for steadying and cues for hand placement    Ambulation/Gait Ambulation/Gait assistance: Min assist Gait Distance (Feet): 100 Feet Assistive device: Rolling walker (2 wheels) Gait Pattern/deviations: Step-through pattern, Decreased stride length, Drifts right/left Gait velocity: decr     General Gait Details: assist to steady and physically maneuver RW in hallway around obstacles   Stairs             Wheelchair Mobility    Modified Rankin (Stroke Patients Only)       Balance Overall balance assessment: Needs assistance Sitting-balance support: Single extremity supported, Feet supported Sitting balance-Leahy Scale: Fair     Standing balance support: Bilateral upper extremity supported, During functional activity Standing balance-Leahy Scale: Poor                              Cognition Arousal/Alertness: Awake/alert Behavior During Therapy: WFL for tasks assessed/performed Overall Cognitive Status: History of cognitive impairments - at baseline                                 General Comments: history of lewy body dementia, periods of paranoia and oriented to self only  Exercises      General Comments General comments (skin integrity, edema, etc.): 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  Would like to confirm with family      Pertinent Vitals/Pain Pain Assessment Pain Assessment: No/denies pain    Home Living                          Prior Function            PT Goals (current goals can  now be found in the care plan section) Acute Rehab PT Goals Patient Stated Goal: none stated PT Goal Formulation: Patient unable to participate in goal setting Time For Goal Achievement: 05/23/22 Potential to Achieve Goals: Fair Progress towards PT goals: Progressing toward goals    Frequency    Min 3X/week      PT Plan Current plan remains appropriate    Co-evaluation              AM-PAC PT "6 Clicks" Mobility   Outcome Measure  Help needed turning from your back to your side while in a flat bed without using bedrails?: A Little Help needed moving from lying on your back to sitting on the side of a flat bed without using bedrails?: A Little Help needed moving to and from a bed to a chair (including a wheelchair)?: A Little Help needed standing up from a chair using your arms (e.g., wheelchair or bedside chair)?: A Little Help needed to walk in hospital room?: A Little Help needed climbing 3-5 steps with a railing? : A Lot 6 Click Score: 17    End of Session Equipment Utilized During Treatment: Gait belt Activity Tolerance: Patient limited by fatigue;Treatment limited secondary to medical complications (Comment) Patient left: in bed;with call bell/phone within reach Nurse Communication: Mobility status PT Visit Diagnosis: Unsteadiness on feet (R26.81);Difficulty in walking, not elsewhere classified (R26.2)     Time: 0981-1914 PT Time Calculation (min) (ACUTE ONLY): 18 min  Charges:  $Therapeutic Activity: 8-22 mins                     Marye Round, PT DPT Acute Rehabilitation Services Pager [phone]  Office [phone]   Kayann Maj E Christain [place] 05/10/2022, 1:32 PM

## 2022-05-10 NOTE — Progress Notes (Signed)
   05/10/22 1600  Clinical Encounter Type  Visited With Patient;Family  Visit Type Initial;Spiritual support  Referral From Family  Consult/Referral To Chaplain   While waiting to go into another patient's room, Chaplain was approached by wife of patient to provide prayer. Chaplain was able to provide support to wife who was also struggling physically and emotionally. Chaplain also spoke with patient and provided a scripture reading,prayer, and encouragement.   Jon Gills, Resident Chaplain [phone]

## 2022-05-11 DIAGNOSIS — Z720 Tobacco use: Secondary | ICD-10-CM | POA: Diagnosis not present

## 2022-05-11 DIAGNOSIS — F0390 Unspecified dementia without behavioral disturbance: Secondary | ICD-10-CM | POA: Diagnosis not present

## 2022-05-11 DIAGNOSIS — N189 Chronic kidney disease, unspecified: Secondary | ICD-10-CM | POA: Diagnosis not present

## 2022-05-11 DIAGNOSIS — I10 Essential (primary) hypertension: Secondary | ICD-10-CM | POA: Diagnosis not present

## 2022-05-11 LAB — URINE CULTURE: Culture: 20000 — AB

## 2022-05-12 DIAGNOSIS — F0390 Unspecified dementia without behavioral disturbance: Secondary | ICD-10-CM | POA: Diagnosis not present

## 2022-05-12 DIAGNOSIS — Z720 Tobacco use: Secondary | ICD-10-CM | POA: Diagnosis not present

## 2022-05-12 DIAGNOSIS — I10 Essential (primary) hypertension: Secondary | ICD-10-CM | POA: Diagnosis not present

## 2022-05-12 DIAGNOSIS — N189 Chronic kidney disease, unspecified: Secondary | ICD-10-CM | POA: Diagnosis not present

## 2022-05-13 DIAGNOSIS — D519 Vitamin B12 deficiency anemia, unspecified: Secondary | ICD-10-CM | POA: Diagnosis not present

## 2022-05-13 DIAGNOSIS — E7800 Pure hypercholesterolemia, unspecified: Secondary | ICD-10-CM | POA: Diagnosis not present

## 2022-05-13 DIAGNOSIS — F0280 Dementia in other diseases classified elsewhere without behavioral disturbance: Secondary | ICD-10-CM | POA: Diagnosis not present

## 2022-05-13 DIAGNOSIS — G309 Alzheimer's disease, unspecified: Secondary | ICD-10-CM | POA: Diagnosis not present

## 2022-05-20 DIAGNOSIS — F02B18 Dementia in other diseases classified elsewhere, moderate, with other behavioral disturbance: Secondary | ICD-10-CM | POA: Diagnosis not present

## 2022-05-20 DIAGNOSIS — N1831 Chronic kidney disease, stage 3a: Secondary | ICD-10-CM | POA: Diagnosis not present

## 2022-05-20 DIAGNOSIS — I10 Essential (primary) hypertension: Secondary | ICD-10-CM | POA: Diagnosis not present

## 2022-05-20 DIAGNOSIS — D519 Vitamin B12 deficiency anemia, unspecified: Secondary | ICD-10-CM | POA: Diagnosis not present

## 2022-05-20 DIAGNOSIS — Z720 Tobacco use: Secondary | ICD-10-CM | POA: Diagnosis not present

## 2022-05-20 DIAGNOSIS — M6281 Muscle weakness (generalized): Secondary | ICD-10-CM | POA: Diagnosis not present

## 2022-06-02 DIAGNOSIS — E7800 Pure hypercholesterolemia, unspecified: Secondary | ICD-10-CM | POA: Diagnosis not present

## 2022-06-02 DIAGNOSIS — F03911 Unspecified dementia, unspecified severity, with agitation: Secondary | ICD-10-CM | POA: Diagnosis not present

## 2022-06-02 DIAGNOSIS — I10 Essential (primary) hypertension: Secondary | ICD-10-CM | POA: Diagnosis not present

## 2022-07-04 ENCOUNTER — Emergency Department (HOSPITAL_COMMUNITY): Payer: Medicare PPO

## 2022-07-04 ENCOUNTER — Other Ambulatory Visit: Payer: Self-pay

## 2022-07-04 ENCOUNTER — Inpatient Hospital Stay (HOSPITAL_COMMUNITY): Payer: Medicare PPO

## 2022-07-04 ENCOUNTER — Inpatient Hospital Stay (HOSPITAL_COMMUNITY)
Admission: EM | Admit: 2022-07-04 | Discharge: 2022-07-12 | DRG: 871 | Disposition: A | Discharge status: Skilled Nursing Facility | Payer: Medicare PPO | Attending: Internal Medicine | Admitting: Internal Medicine

## 2022-07-04 DIAGNOSIS — F05 Delirium due to known physiological condition: Secondary | ICD-10-CM | POA: Diagnosis not present

## 2022-07-04 DIAGNOSIS — J189 Pneumonia, unspecified organism: Secondary | ICD-10-CM | POA: Diagnosis not present

## 2022-07-04 DIAGNOSIS — R0902 Hypoxemia: Secondary | ICD-10-CM | POA: Diagnosis present

## 2022-07-04 DIAGNOSIS — Z91040 Latex allergy status: Secondary | ICD-10-CM | POA: Diagnosis not present

## 2022-07-04 DIAGNOSIS — R509 Fever, unspecified: Secondary | ICD-10-CM | POA: Diagnosis not present

## 2022-07-04 DIAGNOSIS — Z6823 Body mass index (BMI) 23.0-23.9, adult: Secondary | ICD-10-CM | POA: Diagnosis not present

## 2022-07-04 DIAGNOSIS — E876 Hypokalemia: Secondary | ICD-10-CM | POA: Diagnosis present

## 2022-07-04 DIAGNOSIS — E7800 Pure hypercholesterolemia, unspecified: Secondary | ICD-10-CM | POA: Diagnosis not present

## 2022-07-04 DIAGNOSIS — R569 Unspecified convulsions: Secondary | ICD-10-CM | POA: Diagnosis not present

## 2022-07-04 DIAGNOSIS — I493 Ventricular premature depolarization: Secondary | ICD-10-CM | POA: Diagnosis present

## 2022-07-04 DIAGNOSIS — N1831 Chronic kidney disease, stage 3a: Secondary | ICD-10-CM | POA: Diagnosis present

## 2022-07-04 DIAGNOSIS — I10 Essential (primary) hypertension: Secondary | ICD-10-CM | POA: Diagnosis not present

## 2022-07-04 DIAGNOSIS — I639 Cerebral infarction, unspecified: Secondary | ICD-10-CM | POA: Diagnosis not present

## 2022-07-04 DIAGNOSIS — G4089 Other seizures: Secondary | ICD-10-CM | POA: Diagnosis not present

## 2022-07-04 DIAGNOSIS — Z79899 Other long term (current) drug therapy: Secondary | ICD-10-CM

## 2022-07-04 DIAGNOSIS — R4182 Altered mental status, unspecified: Secondary | ICD-10-CM

## 2022-07-04 DIAGNOSIS — R1312 Dysphagia, oropharyngeal phase: Secondary | ICD-10-CM | POA: Diagnosis not present

## 2022-07-04 DIAGNOSIS — A419 Sepsis, unspecified organism: Principal | ICD-10-CM | POA: Diagnosis present

## 2022-07-04 DIAGNOSIS — R41841 Cognitive communication deficit: Secondary | ICD-10-CM | POA: Diagnosis not present

## 2022-07-04 DIAGNOSIS — R404 Transient alteration of awareness: Secondary | ICD-10-CM | POA: Diagnosis not present

## 2022-07-04 DIAGNOSIS — G928 Other toxic encephalopathy: Secondary | ICD-10-CM | POA: Diagnosis not present

## 2022-07-04 DIAGNOSIS — R6520 Severe sepsis without septic shock: Secondary | ICD-10-CM | POA: Diagnosis present

## 2022-07-04 DIAGNOSIS — N179 Acute kidney failure, unspecified: Secondary | ICD-10-CM | POA: Diagnosis present

## 2022-07-04 DIAGNOSIS — F17210 Nicotine dependence, cigarettes, uncomplicated: Secondary | ICD-10-CM | POA: Diagnosis present

## 2022-07-04 DIAGNOSIS — G9341 Metabolic encephalopathy: Secondary | ICD-10-CM

## 2022-07-04 DIAGNOSIS — J690 Pneumonitis due to inhalation of food and vomit: Secondary | ICD-10-CM | POA: Diagnosis present

## 2022-07-04 DIAGNOSIS — I455 Other specified heart block: Secondary | ICD-10-CM | POA: Diagnosis present

## 2022-07-04 DIAGNOSIS — G253 Myoclonus: Secondary | ICD-10-CM | POA: Diagnosis present

## 2022-07-04 DIAGNOSIS — M6281 Muscle weakness (generalized): Secondary | ICD-10-CM | POA: Diagnosis not present

## 2022-07-04 DIAGNOSIS — J90 Pleural effusion, not elsewhere classified: Secondary | ICD-10-CM | POA: Diagnosis not present

## 2022-07-04 DIAGNOSIS — N390 Urinary tract infection, site not specified: Secondary | ICD-10-CM | POA: Diagnosis present

## 2022-07-04 DIAGNOSIS — F03918 Unspecified dementia, unspecified severity, with other behavioral disturbance: Secondary | ICD-10-CM | POA: Diagnosis present

## 2022-07-04 DIAGNOSIS — E440 Moderate protein-calorie malnutrition: Secondary | ICD-10-CM | POA: Diagnosis not present

## 2022-07-04 DIAGNOSIS — N281 Cyst of kidney, acquired: Secondary | ICD-10-CM | POA: Diagnosis not present

## 2022-07-04 DIAGNOSIS — Z66 Do not resuscitate: Secondary | ICD-10-CM | POA: Diagnosis not present

## 2022-07-04 DIAGNOSIS — Z1152 Encounter for screening for COVID-19: Secondary | ICD-10-CM | POA: Diagnosis not present

## 2022-07-04 DIAGNOSIS — K6389 Other specified diseases of intestine: Secondary | ICD-10-CM | POA: Diagnosis not present

## 2022-07-04 DIAGNOSIS — I129 Hypertensive chronic kidney disease with stage 1 through stage 4 chronic kidney disease, or unspecified chronic kidney disease: Secondary | ICD-10-CM | POA: Diagnosis present

## 2022-07-04 DIAGNOSIS — G319 Degenerative disease of nervous system, unspecified: Secondary | ICD-10-CM | POA: Diagnosis not present

## 2022-07-04 DIAGNOSIS — Z7401 Bed confinement status: Secondary | ICD-10-CM | POA: Diagnosis not present

## 2022-07-04 LAB — URINALYSIS, ROUTINE W REFLEX MICROSCOPIC
Bacteria, UA: NONE SEEN
Bilirubin Urine: NEGATIVE
Glucose, UA: NEGATIVE mg/dL
Hgb urine dipstick: NEGATIVE
Ketones, ur: 20 mg/dL — AB
Nitrite: NEGATIVE
Protein, ur: 30 mg/dL — AB
Specific Gravity, Urine: 1.02 (ref 1.005–1.030)
pH: 5 (ref 5.0–8.0)

## 2022-07-04 LAB — COMPREHENSIVE METABOLIC PANEL
ALT: 11 U/L (ref 0–44)
AST: 18 U/L (ref 15–41)
Albumin: 3.9 g/dL (ref 3.5–5.0)
Alkaline Phosphatase: 59 U/L (ref 38–126)
Anion gap: 9 (ref 5–15)
BUN: 11 mg/dL (ref 8–23)
CO2: 24 mmol/L (ref 22–32)
Calcium: 9.3 mg/dL (ref 8.9–10.3)
Chloride: 106 mmol/L (ref 98–111)
Creatinine, Ser: 1.31 mg/dL — ABNORMAL HIGH (ref 0.61–1.24)
GFR, Estimated: 57 mL/min — ABNORMAL LOW (ref >60)
Glucose, Bld: 127 mg/dL — ABNORMAL HIGH (ref 70–99)
Potassium: 3.5 mmol/L (ref 3.5–5.1)
Sodium: 139 mmol/L (ref 135–145)
Total Bilirubin: 1.4 mg/dL — ABNORMAL HIGH (ref 0.3–1.2)
Total Protein: 7.3 g/dL (ref 6.5–8.1)

## 2022-07-04 LAB — CBC WITH DIFFERENTIAL/PLATELET
Abs Immature Granulocytes: 0.06 10*3/uL (ref 0.00–0.07)
Basophils Absolute: 0 10*3/uL (ref 0.0–0.1)
Basophils Relative: 0 %
Eosinophils Absolute: 0 10*3/uL (ref 0.0–0.5)
Eosinophils Relative: 0 %
HCT: 41.2 % (ref 39.0–52.0)
Hemoglobin: 14.2 g/dL (ref 13.0–17.0)
Immature Granulocytes: 0 %
Lymphocytes Relative: 11 %
Lymphs Abs: 1.6 10*3/uL (ref 0.7–4.0)
MCH: 32.2 pg (ref 26.0–34.0)
MCHC: 34.5 g/dL (ref 30.0–36.0)
MCV: 93.4 fL (ref 80.0–100.0)
Monocytes Absolute: 1.7 10*3/uL — ABNORMAL HIGH (ref 0.1–1.0)
Monocytes Relative: 11 %
Neutro Abs: 11.6 10*3/uL — ABNORMAL HIGH (ref 1.7–7.7)
Neutrophils Relative %: 78 %
Platelets: 193 10*3/uL (ref 150–400)
RBC: 4.41 MIL/uL (ref 4.22–5.81)
RDW: 13.1 % (ref 11.5–15.5)
WBC: 15.1 10*3/uL — ABNORMAL HIGH (ref 4.0–10.5)
nRBC: 0 % (ref 0.0–0.2)

## 2022-07-04 LAB — TROPONIN I (HIGH SENSITIVITY)
Troponin I (High Sensitivity): 10 ng/L (ref <18)
Troponin I (High Sensitivity): 11 ng/L (ref <18)

## 2022-07-04 LAB — PROTIME-INR
INR: 1.1 (ref 0.8–1.2)
Prothrombin Time: 14.2 seconds (ref 11.4–15.2)

## 2022-07-04 LAB — LACTIC ACID, PLASMA: Lactic Acid, Venous: 1.6 mmol/L (ref 0.5–1.9)

## 2022-07-04 LAB — APTT: aPTT: 30 seconds (ref 24–36)

## 2022-07-04 LAB — ETHANOL: Alcohol, Ethyl (B): <10 mg/dL (ref <10)

## 2022-07-04 LAB — CBG MONITORING, ED: Glucose-Capillary: 117 mg/dL — ABNORMAL HIGH (ref 70–99)

## 2022-07-04 LAB — SARS CORONAVIRUS 2 BY RT PCR: SARS Coronavirus 2 by RT PCR: NEGATIVE

## 2022-07-04 MED ORDER — LIDOCAINE-EPINEPHRINE (PF) 2 %-1:200000 IJ SOLN
10.0000 mL | Freq: Once | INTRAMUSCULAR | Status: DC
  Administered 2022-07-04: 10 mL
  Filled 2022-07-04: qty 20

## 2022-07-04 MED ORDER — SODIUM CHLORIDE 0.9% FLUSH
3.0000 mL | Freq: Two times a day (BID) | INTRAVENOUS | Status: DC
  Administered 2022-07-04 – 2022-07-12 (×17): 3 mL via INTRAVENOUS

## 2022-07-04 MED ORDER — ROSUVASTATIN CALCIUM 5 MG PO TABS
10.0000 mg | ORAL_TABLET | Freq: Every day | ORAL | Status: DC
  Administered 2022-07-08 – 2022-07-12 (×5): 10 mg via ORAL
  Filled 2022-07-04 (×6): qty 2

## 2022-07-04 MED ORDER — ACETAMINOPHEN 650 MG RE SUPP
650.0000 mg | Freq: Once | RECTAL | Status: AC
  Administered 2022-07-04: 650 mg via RECTAL
  Filled 2022-07-04: qty 1

## 2022-07-04 MED ORDER — MEMANTINE HCL 10 MG PO TABS
10.0000 mg | ORAL_TABLET | Freq: Two times a day (BID) | ORAL | Status: DC
  Administered 2022-07-07 – 2022-07-12 (×11): 10 mg via ORAL
  Filled 2022-07-04 (×12): qty 1

## 2022-07-04 MED ORDER — LACTATED RINGERS IV SOLN: INTRAVENOUS | Status: DC

## 2022-07-04 MED ORDER — DOCUSATE SODIUM 100 MG PO CAPS
100.0000 mg | ORAL_CAPSULE | Freq: Two times a day (BID) | ORAL | Status: DC
  Administered 2022-07-04 – 2022-07-12 (×6): 100 mg via ORAL
  Filled 2022-07-04 (×9): qty 1

## 2022-07-04 MED ORDER — METRONIDAZOLE 500 MG/100ML IV SOLN
500.0000 mg | Freq: Once | INTRAVENOUS | Status: AC
  Administered 2022-07-04: 500 mg via INTRAVENOUS
  Filled 2022-07-04: qty 100

## 2022-07-04 MED ORDER — ACETAMINOPHEN 325 MG PO TABS
650.0000 mg | ORAL_TABLET | Freq: Four times a day (QID) | ORAL | Status: DC | PRN
  Administered 2022-07-08: 650 mg via ORAL
  Filled 2022-07-04: qty 2

## 2022-07-04 MED ORDER — VANCOMYCIN HCL 1500 MG/300ML IV SOLN
1500.0000 mg | Freq: Once | INTRAVENOUS | Status: AC
  Administered 2022-07-04: 1500 mg via INTRAVENOUS
  Filled 2022-07-04: qty 300

## 2022-07-04 MED ORDER — SODIUM CHLORIDE 0.9 % IV SOLN
2.0000 g | Freq: Once | INTRAVENOUS | Status: AC
  Administered 2022-07-04: 2 g via INTRAVENOUS
  Filled 2022-07-04: qty 12.5

## 2022-07-04 MED ORDER — LORAZEPAM 2 MG/ML IJ SOLN
INTRAMUSCULAR | Status: AC
  Administered 2022-07-04: 2 mg
  Filled 2022-07-04: qty 1

## 2022-07-04 MED ORDER — BISACODYL 5 MG PO TBEC: 5.0000 mg | DELAYED_RELEASE_TABLET | Freq: Every day | ORAL | Status: DC | PRN

## 2022-07-04 MED ORDER — LACTATED RINGERS IV BOLUS (SEPSIS)
1000.0000 mL | Freq: Once | INTRAVENOUS | Status: AC
  Administered 2022-07-04: 1000 mL via INTRAVENOUS

## 2022-07-04 MED ORDER — LACTATED RINGERS IV BOLUS (SEPSIS): 1000.0000 mL | Freq: Once | INTRAVENOUS | Status: DC

## 2022-07-04 MED ORDER — ENOXAPARIN SODIUM 40 MG/0.4ML IJ SOSY
40.0000 mg | PREFILLED_SYRINGE | INTRAMUSCULAR | Status: DC
  Administered 2022-07-04 – 2022-07-12 (×8): 40 mg via SUBCUTANEOUS
  Filled 2022-07-04 (×9): qty 0.4

## 2022-07-04 MED ORDER — ACETAMINOPHEN 650 MG RE SUPP
650.0000 mg | Freq: Four times a day (QID) | RECTAL | Status: DC | PRN
  Administered 2022-07-04 – 2022-07-07 (×4): 650 mg via RECTAL
  Filled 2022-07-04 (×4): qty 1

## 2022-07-04 MED ORDER — ONDANSETRON HCL 4 MG/2ML IJ SOLN: 4.0000 mg | Freq: Four times a day (QID) | INTRAMUSCULAR | Status: DC | PRN

## 2022-07-04 MED ORDER — MORPHINE SULFATE (PF) 2 MG/ML IV SOLN
2.0000 mg | INTRAVENOUS | Status: DC | PRN
  Administered 2022-07-04: 2 mg via INTRAVENOUS
  Filled 2022-07-04: qty 1

## 2022-07-04 MED ORDER — IOHEXOL 350 MG/ML SOLN
75.0000 mL | Freq: Once | INTRAVENOUS | Status: AC | PRN
  Administered 2022-07-04: 75 mL via INTRAVENOUS

## 2022-07-04 MED ORDER — ONDANSETRON HCL 4 MG PO TABS: 4.0000 mg | ORAL_TABLET | Freq: Four times a day (QID) | ORAL | Status: DC | PRN

## 2022-07-04 MED ORDER — VANCOMYCIN HCL IN DEXTROSE 1-5 GM/200ML-% IV SOLN: 1000.0000 mg | Freq: Once | INTRAVENOUS | Status: DC

## 2022-07-04 MED ORDER — POLYETHYLENE GLYCOL 3350 17 G PO PACK: 17.0000 g | PACK | Freq: Every day | ORAL | Status: DC | PRN

## 2022-07-04 MED ORDER — LACTATED RINGERS IV BOLUS (SEPSIS)
500.0000 mL | Freq: Once | INTRAVENOUS | Status: AC
  Administered 2022-07-04: 500 mL via INTRAVENOUS

## 2022-07-04 MED ORDER — HYDRALAZINE HCL 20 MG/ML IJ SOLN: 5.0000 mg | INTRAMUSCULAR | Status: DC | PRN

## 2022-07-04 NOTE — Progress Notes (Addendum)
Patient brought to Fluoroscopy for LP attempt. Attempted to position patient for procedure, however the patient was agitated and unable to tolerate lying prone for the procedure at this time. Excessive movement of the lower torso and lower extremities led to concern that patient could not tolerate procedure safely if an attempt was to be made in current condition. No attempt was made today, and patient was returned to their room. Future attempts may require the use of sedation for the patient to tolerate the procedure.

## 2022-07-04 NOTE — ED Notes (Signed)
RN assisted EDP with LP attempt

## 2022-07-04 NOTE — ED Notes (Signed)
Pt back from IR. Pt was dropped off, not put back on monitor. Pt placed back on cardiac monitor and primofit in place.

## 2022-07-04 NOTE — ED Notes (Signed)
Patient transported to IR [facility]

## 2022-07-04 NOTE — Procedures (Signed)
Patient Name: Eddie Morris  MRN: [id number]  Epilepsy Attending: Lora Havens  Referring Physician/Provider: Karmen Bongo, MD  Date: 07/04/2022 Duration: 23.56 mins  Patient history: 71 year old male with altered mental status and generalized tonic-clonic seizure.  EEG to evaluate for seizure.  Level of alertness: Awake, asleep  AEDs during EEG study: Ativan  Technical aspects: This EEG study was done with scalp electrodes positioned according to the 10-20 International system of electrode placement. Electrical activity was reviewed with band pass filter of 1-70Hz , sensitivity of 7 uV/mm, display speed of 23mm/sec with a 60Hz  notched filter applied as appropriate. EEG data were recorded continuously and digitally stored.  Video monitoring was available and reviewed as appropriate.  Description: No clear posterior dominant rhythm was seen. EEG showed continuous generalized 5-7 theta slowing. Sleep was characterized by vertex waves, sleep spindles (12 to 14 Hz), maximal frontocentral region. Hyperventilation and photic stimulation were not performed.     ABNORMALITY - Continuous slow, generalized  IMPRESSION: This study is suggestive of moderate diffuse encephalopathy, nonspecific etiology. No seizures or epileptiform discharges were seen throughout the recording.  Please note that lack of epileptiform activity does not exclude the diagnosis of epilepsy.  Edwards Mckelvie Barbra Sarks

## 2022-07-04 NOTE — Progress Notes (Signed)
EEG complete - results pending 
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
## 2022-07-04 NOTE — ED Provider Notes (Addendum)
  [facility] Procedure Note   Procedures .Lumbar Puncture  Date/Time: 07/04/2022 9:43 AM  Performed by: Valarie Merino, MD Authorized by: Valarie Merino, MD   Consent:    Consent obtained:  Verbal   Consent given by:  Patient and spouse   Risks, benefits, and alternatives were discussed: yes     Risks discussed:  Bleeding, headache, nerve damage, repeat procedure, infection and pain   Alternatives discussed:  No treatment Universal protocol:    Site/side marked: yes     Patient identity confirmed:  Verbally with patient and arm band Pre-procedure details:    Procedure purpose:  Diagnostic   Preparation: Patient was prepped and draped in usual sterile fashion   Anesthesia:    Anesthesia method:  Local infiltration   Local anesthetic:  Lidocaine 2% WITH epi Procedure details:    Lumbar space:  L3-L4 interspace   Patient position:  L lateral decubitus   Needle gauge:  20   Needle type:  Spinal needle - Quincke tip   Needle length (in):  3.5   Ultrasound guidance: no     Number of attempts:  1 Post-procedure details:    Puncture site:  Adhesive bandage applied Comments:     LP attempt unsuccessful.  Patient's mental status was decreased to the level that he could not follow commands however he was agitated with painful stimulation and would not remain still enough for successful LP.     IR made aware of case.  They will evaluate for LP attempt.    Valarie Merino, MD 07/04/22 [id number]    Valarie Merino, MD 07/04/22 1040

## 2022-07-04 NOTE — ED Provider Notes (Signed)
[facility] EMERGENCY DEPARTMENT Provider Note   CSN: [id number] Arrival date & time: 07/04/22  0347     History  Chief Complaint  Patient presents with   Chest Pain   Seizures   Level 5 caveat due to dementia Eddie Morris is a 71 y.o. male.  HPI Most of the history is provided by wife.  Patient has a history of dementia.  She reports over the past day he has been acting "off" She reports he has had difficulty walking and increasing trips to the bathroom.  She reports she has had frequent urination.  He is also been reporting abdominal pain.  She reports at one point he pointed to his chest and reported his "heart hurt"     When patient was in triage, he had a generalized seizure that lasted approximately 2 minutes.  He was given Ativan.  Patient was initially unresponsive and required supplemental oxygen.  He is now waking up.  Patient has no known history of seizures.  Home Medications Prior to Admission medications   Medication Sig Start Date End Date Taking? Authorizing Provider  amLODipine (NORVASC) 10 MG tablet Take 10 mg by mouth daily. 03/12/18  Yes [provider]  cetirizine (ZYRTEC) 10 MG tablet Take 10 mg by mouth at bedtime.   Yes [provider]  cyanocobalamin (VITAMIN B12) 500 MCG tablet Take 500 mcg by mouth daily.   Yes [provider]  donepezil (ARICEPT) 10 MG tablet Take 10 mg by mouth at bedtime. 12/04/19  Yes [provider]  memantine (NAMENDA) 10 MG tablet Take 10 mg by mouth 2 (two) times daily. 10/21/19  Yes [provider]  rosuvastatin (CRESTOR) 10 MG tablet Take 10 mg by mouth daily. 11/13/19  Yes [provider]      Allergies    Latex    Review of Systems   Review of Systems  Unable to perform ROS: Dementia    Physical Exam Updated Vital Signs BP (!) 106/59 (BP Location: Left Arm)   Pulse 79   Temp (!) 102.1 F (38.9 C) (Rectal)   Resp 18   Ht 1.829 m (6')    Wt 79.4 kg   SpO2 98%   BMI 23.73 kg/m  Physical Exam CONSTITUTIONAL: Elderly, disheveled HEAD: Normocephalic/atraumatic, no signs of trauma EYES: EOMI/PERRL, no nystagmus ENMT: Mucous membranes moist NECK: supple no meningeal signs SPINE/BACK:entire spine nontender CV: S1/S2 noted, no murmurs/rubs/gallops noted LUNGS: Decreased breath sounds bilaterally ABDOMEN: soft, diffuse moderate tenderness GU: Patient wearing a diaper, no scrotal erythema or edema noted NEURO: Pt is somnolent but easily arousable.  He moves all extremities x4.  He becomes more talkative throughout the exam, though appears confused.  He laughs intermittently through the exam EXTREMITIES: pulses normal/equal, full ROM All other extremities/joints palpated/ranged and nontender SKIN: warm, diaphoretic  ED Results / Procedures / Treatments   Labs (all labs ordered are listed, but only abnormal results are displayed) Labs Reviewed  COMPREHENSIVE METABOLIC PANEL - Abnormal; Notable for the following components:      Result Value   Glucose, Bld 127 (*)    Creatinine, Ser 1.31 (*)    Total Bilirubin 1.4 (*)    GFR, Estimated 57 (*)    All other components within normal limits  CBC WITH DIFFERENTIAL/PLATELET - Abnormal; Notable for the following components:   WBC 15.1 (*)    Neutro Abs 11.6 (*)    Monocytes Absolute 1.7 (*)    All other components within  normal limits  URINALYSIS, ROUTINE W REFLEX MICROSCOPIC - Abnormal; Notable for the following components:   APPearance HAZY (*)    Ketones, ur 20 (*)    Protein, ur 30 (*)    Leukocytes,Ua TRACE (*)    All other components within normal limits  CBG MONITORING, ED - Abnormal; Notable for the following components:   Glucose-Capillary 117 (*)    All other components within normal limits  CULTURE, BLOOD (ROUTINE X 2)  CULTURE, BLOOD (ROUTINE X 2)  URINE CULTURE  SARS CORONAVIRUS 2 BY RT PCR  LACTIC ACID, PLASMA  PROTIME-INR  APTT  ETHANOL  RAPID URINE  DRUG SCREEN, HOSP PERFORMED  TROPONIN I (HIGH SENSITIVITY)  TROPONIN I (HIGH SENSITIVITY)    EKG EKG Interpretation  Date/Time:  Monday July 04 2022 03:53:28 EDT Ventricular Rate:  96 PR Interval:  190 QRS Duration: 100 QT Interval:  330 QTC Calculation: 416 R Axis:   -47 Text Interpretation: Sinus rhythm with marked sinus arrhythmia with Premature atrial complexes Left anterior fascicular block Left ventricular hypertrophy with repolarization abnormality ( R in aVL , Cornell product , Romhilt-Estes ) Abnormal ECG Confirmed by Ripley Fraise [phone]) on 07/04/2022 4:43:55 AM  Radiology CT Head Wo Contrast  Result Date: 07/04/2022 CLINICAL DATA:  71 year old male with new onset seizure. EXAM: CT HEAD WITHOUT CONTRAST TECHNIQUE: Contiguous axial images were obtained from the base of the skull through the vertex without intravenous contrast. RADIATION DOSE REDUCTION: This exam was performed according to the departmental dose-optimization program which includes automated exposure control, adjustment of the mA and/or kV according to patient size and/or use of iterative reconstruction technique. COMPARISON:  Brain MRI 05/08/2022 and earlier. FINDINGS: Brain: Cerebral volume does not appear significantly changed since the 2019 MRI. Confluent bilateral cerebral white matter hypodensity corresponding to T2/FLAIR hyperintensity at that time, and stable gray-white matter differentiation compared to August CT. Mild chronic heterogeneity in the right thalamus. No midline shift, ventriculomegaly, mass effect, evidence of mass lesion, intracranial hemorrhage or evidence of cortically based acute infarction. Vascular: Calcified atherosclerosis at the skull base. No suspicious intracranial vascular hyperdensity. Skull: Stable. Hyperostosis of the calvarium. No acute osseous abnormality identified. Sinuses/Orbits: Visualized paranasal sinuses and mastoids are clear. Other: No acute orbit or scalp soft tissue  finding. IMPRESSION: 1. No acute intracranial abnormality. 2. Stable non contrast CT appearance of the brain with advanced cerebral white matter disease. Electronically Signed   By: Genevie Ann M.D.   On: 07/04/2022 06:59   DG Chest Port 1 View  Result Date: 07/04/2022 CLINICAL DATA:  71 year old male with sepsis. EXAM: PORTABLE CHEST 1 VIEW COMPARISON:  Chest x-ray 05/09/2022. FINDINGS: Lung volumes are low. No consolidative airspace disease. No pleural effusions. No pneumothorax. Crowding of the pulmonary vasculature, accentuated by low lung volumes, but no frank pulmonary edema. Heart size appears borderline enlarged, likely accentuated by patient's rotation to the right which causes distortion of upper mediastinal contours. Atherosclerotic calcifications in the thoracic aorta. IMPRESSION: 1. Low lung volumes without radiographic evidence of acute cardiopulmonary disease. 2. Aortic atherosclerosis. Electronically Signed   By: Vinnie Langton M.D.   On: 07/04/2022 05:31    Procedures .Critical Care  Performed by: Ripley Fraise, MD Authorized by: Ripley Fraise, MD   Critical care provider statement:    Critical care time (minutes):  100   Critical care start time:  07/04/2022 5:10 AM   Critical care end time:  07/04/2022 6:50 AM   Critical care time was exclusive of:  Separately billable  procedures and treating other patients   Critical care was necessary to treat or prevent imminent or life-threatening deterioration of the following conditions:  Sepsis and CNS failure or compromise   Critical care was time spent personally by me on the following activities:  Obtaining history from patient or surrogate, examination of patient, evaluation of patient's response to treatment, development of treatment plan with patient or surrogate, ordering and review of laboratory studies, ordering and review of radiographic studies, pulse oximetry, re-evaluation of patient's condition and ordering and performing  treatments and interventions   I assumed direction of critical care for this patient from another provider in my specialty: no       Medications Ordered in ED Medications  lactated ringers bolus 1,000 mL (1,000 mLs Intravenous New Bag/Given 07/04/22 0531)    And  lactated ringers bolus 500 mL (has no administration in time range)  metroNIDAZOLE (FLAGYL) IVPB 500 mg (500 mg Intravenous New Bag/Given 07/04/22 0610)  vancomycin (VANCOREADY) IVPB 1500 mg/300 mL (has no administration in time range)  LORazepam (ATIVAN) 2 MG/ML injection (2 mg  Given 07/04/22 0455)  acetaminophen (TYLENOL) suppository 650 mg (650 mg Rectal Given 07/04/22 0508)  ceFEPIme (MAXIPIME) 2 g in sodium chloride 0.9 % 100 mL IVPB (0 g Intravenous Stopped 07/04/22 0603)  iohexol (OMNIPAQUE) 350 MG/ML injection 75 mL (75 mLs Intravenous Contrast Given 07/04/22 3790)    ED Course/ Medical Decision Making/ A&P Clinical Course as of 07/04/22 0706  Mon Jul 04, 2022  0514 Patient seen after he was moved to the main ED.  He arrived for chest pain abdominal pain, probably had a seizure in triage.  No known history of seizures, no recent trauma.  Patient is now waking up.  He appears to be getting back to his baseline.  He was found to be febrile with a rectal temp of 102.  Sepsis work-up was been initiated.  Will likely need CT head due to seizure, but also CT abdomen pelvis due to pain.  We will follow closely. [DW]  0524 WBC(!): 15.1 leukocytosis [DW]  0600 Patient resting comfortably.  No acute distress.  Vitals are appropriate.  X-ray negative, no signs of UTI.  We will proceed with CT head and CT abdomen pelvis [DW]  0704 Pt now more somnolent, though did receive ativan which could be playing a role.  CT imaging pending at this time. Signed out to dr Rodena Medin at shift change  [DW]    Clinical Course User Index [DW] Zadie Rhine, MD                           Medical Decision Making Amount and/or Complexity of Data  Reviewed Labs: ordered. Decision-making details documented in ED Course. Radiology: ordered.  Risk OTC drugs. Prescription drug management.   This patient presents to the ED for concern of abdominal pain, seizure, chest pain, this involves an extensive number of treatment options, and is a complaint that carries with it a high risk of complications and morbidity.  The differential diagnosis includes but is not limited to acute coronary syndrome, aortic dissection, pulmonary embolism, pericarditis, pneumothorax, pneumonia, myocarditis, pleurisy, esophageal rupture cholecystitis, cholelithiasis, pancreatitis, gastritis, peptic ulcer disease, appendicitis, bowel obstruction, bowel perforation, diverticulitis, AAA, ischemic bowel, UTI CVA, intracranial hemorrhage, acute coronary syndrome, renal failure, urinary tract infection, electrolyte disturbance, pneumonia    Comorbidities that complicate the patient evaluation: Patient's presentation is complicated by their history of dementia, hypertension  Social Determinants  of Health: Patient's  cognitive deficit   increases the complexity of managing their presentation  Additional history obtained: Additional history obtained from spouse Records reviewed previous admission documents recent admission for gait instability and dementia  Lab Tests: I Ordered, and personally interpreted labs.  The pertinent results include:  leukocytosis  Imaging Studies ordered: I ordered imaging studies including CT scan head and X-ray chest   I independently visualized and interpreted imaging which showed no acute findings I agree with the radiologist interpretation  Cardiac Monitoring: The patient was maintained on a cardiac monitor.  I personally viewed and interpreted the cardiac monitor which showed an underlying rhythm of:  sinus rhythm  Medicines ordered and prescription drug management: I ordered medication including IV fluids and antibiotics  for  presumed sepsis  Reevaluation of the patient after these medicines showed that the patient    stayed the same  Critical Interventions:  IV fluids and antibiotics    Reevaluation: After the interventions noted above, I reevaluated the patient and found that they have :stayed the same  Complexity of problems addressed: Patient's presentation is most consistent with  acute presentation with potential threat to life or bodily function  Disposition: After consideration of the diagnostic results and the patient's response to treatment,  I feel that the patent would benefit from admission   .           Final Clinical Impression(s) / ED Diagnoses Final diagnoses:  Seizure-like activity [facility])    Rx / [place] Orders ED Discharge Orders     None         Ripley Fraise, MD 07/04/22 [phone]

## 2022-07-04 NOTE — ED Provider Triage Note (Addendum)
Emergency Medicine Provider Triage Evaluation Note  Eddie Morris , a 71 y.o. male  was evaluated in triage.  Pt with history of dementia presents with his wife at the bedside.  She states that he was more confused than his baseline today, went to bed normally but woke up around 2 AM complaining of pain in his upper abdomen and lower chest.  Prompting her ED visit. During my interview with the patient and his wife, patient began to have generalized tonic-clonic seizure activity witnessed by this provider and ED RN.  2 mg of IM Ativan administered in the left deltoid.  Total of 2 minutes of seizure-like activity which did not stop until after administration of Ativan.  Per wife no history of seizure activity in the past.  No loss of pulses during this episode the patient did become hypoxic to the 70s, saturations improved with supplemental oxygen.  Review of Systems  Positive: As above, increased urination Negative: Shortness of breath  Physical Exam  BP 138/67 (BP Location: Right Arm)   Pulse (!) 113   Temp 99.9 F (37.7 C) (Oral)   Resp 15   SpO2 93%  Gen:   Seizure-like activity in triage, unresponsive during this time, total of 2 minutes, did not settle after Ativan. Resp:  Normal effort prior to seizure-like activity,.  Apnea for approximate 10 seconds but no sustained apnea.  Option saturation decreased to the 70s but improved with supplemental oxygen.   MSK:   Moves extremities without difficulty  Other:  As above  Medical Decision Making  Medically screening exam initiated at 4:48 AM.  Appropriate orders placed.  Eliott Cerny was informed that the remainder of the evaluation will be completed by another provider, this initial triage assessment does not replace that evaluation, and the importance of remaining in the ED until their evaluation is complete.  Charge RN made aware, patient emergently transported to hallway 21.  Seizure-like activity stopped in route to ED bed,  vital signs normalized, EDP Dr. Christy Gentles at the bedside.  This chart was dictated using voice recognition software, Dragon. Despite the best efforts of this provider to proofread and correct errors, errors may still occur which can change documentation meaning.    Emeline Darling, PA-C 07/04/22 [place], [place], PA-C 07/04/22 [phone]

## 2022-07-04 NOTE — ED Triage Notes (Signed)
Pt in triage w/ wife.  He reports chest pan but unable to give further information due to dementia (baseline).  Pt's wife reports 'he has been off all day."  She also reports increased urination and heavy breathing.    AS RN and PA was assessing pt, he began to have a seizure that lasted 2 minutes and ended shortly after given 2mg  of ativan.  Pt was unresponsive and O2 dropped to 76% at which time a blow by bag was applied w/ O2.  He was taken to hall bed 21 and EDP was bedside.  Pt became alert.

## 2022-07-04 NOTE — Progress Notes (Signed)
Antibiotics given 0532, blood cultures drawn @ 0536

## 2022-07-04 NOTE — Sepsis Progress Note (Signed)
[facility] following for Sepsis Protocol 

## 2022-07-05 ENCOUNTER — Inpatient Hospital Stay (HOSPITAL_COMMUNITY): Payer: Medicare PPO

## 2022-07-05 ENCOUNTER — Encounter (HOSPITAL_COMMUNITY): Payer: Self-pay | Admitting: Internal Medicine

## 2022-07-05 DIAGNOSIS — I455 Other specified heart block: Secondary | ICD-10-CM

## 2022-07-05 DIAGNOSIS — R569 Unspecified convulsions: Secondary | ICD-10-CM

## 2022-07-05 DIAGNOSIS — E7800 Pure hypercholesterolemia, unspecified: Secondary | ICD-10-CM

## 2022-07-05 DIAGNOSIS — N1831 Chronic kidney disease, stage 3a: Secondary | ICD-10-CM

## 2022-07-05 DIAGNOSIS — I10 Essential (primary) hypertension: Secondary | ICD-10-CM

## 2022-07-05 DIAGNOSIS — F03918 Unspecified dementia, unspecified severity, with other behavioral disturbance: Secondary | ICD-10-CM | POA: Diagnosis not present

## 2022-07-05 DIAGNOSIS — G9341 Metabolic encephalopathy: Secondary | ICD-10-CM | POA: Diagnosis not present

## 2022-07-05 DIAGNOSIS — R509 Fever, unspecified: Secondary | ICD-10-CM

## 2022-07-05 LAB — CBC
HCT: 31.7 % — ABNORMAL LOW (ref 39.0–52.0)
Hemoglobin: 10.9 g/dL — ABNORMAL LOW (ref 13.0–17.0)
MCH: 32.2 pg (ref 26.0–34.0)
MCHC: 34.4 g/dL (ref 30.0–36.0)
MCV: 93.5 fL (ref 80.0–100.0)
Platelets: 129 10*3/uL — ABNORMAL LOW (ref 150–400)
RBC: 3.39 MIL/uL — ABNORMAL LOW (ref 4.22–5.81)
RDW: 13.2 % (ref 11.5–15.5)
WBC: 12.3 10*3/uL — ABNORMAL HIGH (ref 4.0–10.5)
nRBC: 0 % (ref 0.0–0.2)

## 2022-07-05 LAB — CSF CELL COUNT WITH DIFFERENTIAL
RBC Count, CSF: 177 /mm3 — ABNORMAL HIGH
Tube #: 3
WBC, CSF: 2 /mm3 (ref 0–5)

## 2022-07-05 LAB — BASIC METABOLIC PANEL
Anion gap: 16 — ABNORMAL HIGH (ref 5–15)
BUN: 11 mg/dL (ref 8–23)
CO2: 17 mmol/L — ABNORMAL LOW (ref 22–32)
Calcium: 8.8 mg/dL — ABNORMAL LOW (ref 8.9–10.3)
Chloride: 107 mmol/L (ref 98–111)
Creatinine, Ser: 1.21 mg/dL (ref 0.61–1.24)
GFR, Estimated: >60 mL/min (ref >60)
Glucose, Bld: 83 mg/dL (ref 70–99)
Potassium: 4.2 mmol/L (ref 3.5–5.1)
Sodium: 140 mmol/L (ref 135–145)

## 2022-07-05 LAB — MENINGITIS/ENCEPHALITIS PANEL (CSF)

## 2022-07-05 LAB — URINE CULTURE: Culture: NO GROWTH

## 2022-07-05 LAB — PROTEIN AND GLUCOSE, CSF
Glucose, CSF: 54 mg/dL (ref 40–70)
Total  Protein, CSF: 69 mg/dL — ABNORMAL HIGH (ref 15–45)

## 2022-07-05 MED ORDER — ACETAMINOPHEN 10 MG/ML IV SOLN
1000.0000 mg | Freq: Four times a day (QID) | INTRAVENOUS | Status: AC | PRN
  Administered 2022-07-05 – 2022-07-06 (×3): 1000 mg via INTRAVENOUS
  Filled 2022-07-05 (×3): qty 100

## 2022-07-05 MED ORDER — ACETAMINOPHEN 650 MG RE SUPP
650.0000 mg | Freq: Once | RECTAL | Status: AC
  Administered 2022-07-05: 650 mg via RECTAL
  Filled 2022-07-05: qty 1

## 2022-07-05 MED ORDER — LIDOCAINE HCL (PF) 1 % IJ SOLN
5.0000 mL | Freq: Once | INTRAMUSCULAR | Status: AC
  Administered 2022-07-05: 2 mL
  Filled 2022-07-05: qty 5

## 2022-07-05 MED ORDER — METRONIDAZOLE 500 MG/100ML IV SOLN
500.0000 mg | Freq: Two times a day (BID) | INTRAVENOUS | Status: DC
  Administered 2022-07-05 – 2022-07-06 (×2): 500 mg via INTRAVENOUS
  Filled 2022-07-05 (×2): qty 100

## 2022-07-05 MED ORDER — SODIUM CHLORIDE 0.9 % IV SOLN
500.0000 mg | INTRAVENOUS | Status: DC
  Administered 2022-07-05 – 2022-07-06 (×2): 500 mg via INTRAVENOUS
  Filled 2022-07-05 (×2): qty 5

## 2022-07-05 MED ORDER — SODIUM CHLORIDE 0.9 % IV SOLN
3.0000 g | Freq: Four times a day (QID) | INTRAVENOUS | Status: DC
  Filled 2022-07-05: qty 8

## 2022-07-05 MED ORDER — SODIUM CHLORIDE 0.9 % IV SOLN: INTRAVENOUS | Status: DC

## 2022-07-05 MED ORDER — VANCOMYCIN HCL 1250 MG/250ML IV SOLN
1250.0000 mg | INTRAVENOUS | Status: DC
  Administered 2022-07-05: 1250 mg via INTRAVENOUS
  Filled 2022-07-05 (×2): qty 250

## 2022-07-05 MED ORDER — SODIUM CHLORIDE 0.9 % IV SOLN
2.0000 g | Freq: Three times a day (TID) | INTRAVENOUS | Status: DC
  Administered 2022-07-05 – 2022-07-06 (×3): 2 g via INTRAVENOUS
  Filled 2022-07-05 (×3): qty 12.5

## 2022-07-05 NOTE — Progress Notes (Signed)
PROGRESS NOTE  Naazir Forshay [id number] DOB: Dec 06, 1950 DOA: 07/04/2022 PCP: Seward Carol, MD  HPI/Recap of past 24 hours: Shaul Larmon is a 71 y.o. male with medical history significant of HTN, HLD, stage 3a CKD, and dementia presenting with AMS.  His wife reported that he was more altered than his usual baseline for the last day or two.  He experienced acute onset of GTC seizure activity in triage with sats dropping to the 70s, some periodic small myoclonic jerks. He also had an episode of about ?6-second pause followed by gradual return to HR in the 60s. At baseline, he is able to to dress himself, sometimes needs assistance and mostly independent with his ADLs. He mostly struggles with his reasoning and logic. In the ED, seizure-like episode in triage with apnea, O2 sat 70s. T102 following seizure. Code sepsis activated.  Patient admitted for further management.    Today, patient still remains drowsy, unable to follow any commands or engage in any meaningful conversations.   Assessment/Plan: Principal Problem:   Acute metabolic encephalopathy Active Problems:   Chronic kidney disease, stage 3a (HCC)   Dementia with behavioral disturbance (HCC)   Essential hypertension   Pure hypercholesterolemia   Seizure-like activity (HCC)   Sinus pause   Possible seizure Patient with an episode GTC activity in triage followed by temp 102 EEG showed no seizure or epileptiform discharges CT head unremarkable Discussed with neurology Dr. Leonel Ramsay, will follow Fall precautions/seizure precautions  Sepsis likely 2/2 aspiration pneumonia vs UTI Noted fever, tachycardia, leukocytosis Fever occurred shortly after seizure-like activity, ??Aspiration pneumonia Lactic acid unremarkable Procalcitonin pending LP done on 10/17, cell count currently unremarkable UA showed trace leukocyte, UC pending BC x2 pending, CSF culture pending Repeat chest x-ray showed airspace opacity  in the left mid to basilar lung suspicious for pneumonia CTA abdomen/pelvis showed only diffuse bladder wall thickening which is nonspecific and can be seen in UTI, otherwise unremarkable Gentle IV hydration Start broad-spectrum, vancomycin, cefepime, azithromycin, Flagyl, plan to de-escalate pending clinical picture Monitor closely  Acute metabolic encephalopathy Underlying dementia Likely 2/2 above Management as above  Sinus pause Noted an episode of pause for about 6 seconds on monitor Troponin negative EKG with no acute ST changes, noted PVCs Placed on telemetry Consult cardiology if reoccurs Hold Aricept  HTN Hold amlodipine, resume when able IV hydralazine prn   HLD Resume Crestor once able   Stage 3a CKD Stable Daily BMP   Dementia Continue Namenda, hold Aricept as above Delirium precautions Haldol prn for severe agitation      Estimated body mass index is 23.73 kg/m as calculated from the following:   Height as of this encounter: 6' (1.829 m).   Weight as of this encounter: 79.4 kg.     Code Status: DNR  Family Communication: None at bedside  Disposition Plan: Status is: Inpatient Remains inpatient appropriate because: Level of care      Consultants: Neurology  Procedures: None  Antimicrobials: Vancomycin Cefepime Azithromycin Flagyl  DVT prophylaxis: Lovenox   Objective: Vitals:   07/05/22 0745 07/05/22 1137 07/05/22 1143 07/05/22 1553  BP: (!) 149/79 (!) 121/57 (!) 121/57 136/83  Pulse: (!) 101 (!) 104 100 (!) 108  Resp: (!) 28  20 (!) 26  Temp: 97.8 F (36.6 C) 99.8 F (37.7 C) 99.8 F (37.7 C) (!) 103 F (39.4 C)  TempSrc: Oral Axillary Axillary Axillary  SpO2: 97% 92% 91% 93%  Weight:      Height:  Intake/Output Summary (Last 24 hours) at 07/05/2022 1607 Last data filed at 07/04/2022 1721 Gross per 24 hour  Intake --  Output 900 ml  Net -900 ml   Filed Weights   07/04/22 0500  Weight: 79.4 kg     Exam: General: NAD, chronically ill-appearing, drowsy, unable to engage in meaningful conversation Cardiovascular: S1, S2 present Respiratory: CTAB Abdomen: Soft, nontender, nondistended, bowel sounds present Musculoskeletal: No bilateral pedal edema noted Skin: Normal Psychiatry: Unable to assess    Data Reviewed: CBC: Recent Labs  Lab 07/04/22 0440 07/05/22 0832  WBC 15.1* 12.3*  NEUTROABS 11.6*  --   HGB 14.2 10.9*  HCT 41.2 31.7*  MCV 93.4 93.5  PLT 193 [id number]*   Basic Metabolic Panel: Recent Labs  Lab 07/04/22 0440 07/05/22 0403  NA 139 140  K 3.5 4.2  CL 106 107  CO2 24 17*  GLUCOSE 127* 83  BUN 11 11  CREATININE 1.31* 1.21  CALCIUM 9.3 8.8*   GFR: Estimated Creatinine Clearance: 59.7 mL/min (by C-G formula based on SCr of 1.21 mg/dL). Liver Function Tests: Recent Labs  Lab 07/04/22 0440  AST 18  ALT 11  ALKPHOS 59  BILITOT 1.4*  PROT 7.3  ALBUMIN 3.9   No results for input(s): "LIPASE", "AMYLASE" in the last 168 hours. No results for input(s): "AMMONIA" in the last 168 hours. Coagulation Profile: Recent Labs  Lab 07/04/22 0440  INR 1.1   Cardiac Enzymes: No results for input(s): "CKTOTAL", "CKMB", "CKMBINDEX", "TROPONINI" in the last 168 hours. BNP (last 3 results) No results for input(s): "PROBNP" in the last 8760 hours. HbA1C: No results for input(s): "HGBA1C" in the last 72 hours. CBG: Recent Labs  Lab 07/04/22 0703  GLUCAP 117*   Lipid Profile: No results for input(s): "CHOL", "HDL", "LDLCALC", "TRIG", "CHOLHDL", "LDLDIRECT" in the last 72 hours. Thyroid Function Tests: No results for input(s): "TSH", "T4TOTAL", "FREET4", "T3FREE", "THYROIDAB" in the last 72 hours. Anemia Panel: No results for input(s): "VITAMINB12", "FOLATE", "FERRITIN", "TIBC", "IRON", "RETICCTPCT" in the last 72 hours. Urine analysis:    Component Value Date/Time   COLORURINE YELLOW 07/04/2022 0505   APPEARANCEUR HAZY (A) 07/04/2022 0505   LABSPEC  1.020 07/04/2022 0505   PHURINE 5.0 07/04/2022 0505   GLUCOSEU NEGATIVE 07/04/2022 0505   HGBUR NEGATIVE 07/04/2022 0505   BILIRUBINUR NEGATIVE 07/04/2022 0505   KETONESUR 20 (A) 07/04/2022 0505   PROTEINUR 30 (A) 07/04/2022 0505   NITRITE NEGATIVE 07/04/2022 0505   LEUKOCYTESUR TRACE (A) 07/04/2022 0505   Sepsis Labs: @LABRCNTIP (procalcitonin:4,lacticidven:4)  ) Recent Results (from the past 240 hour(s))  Urine Culture     Status: None   Collection Time: 07/04/22  5:05 AM   Specimen: In/Out Cath Urine  Result Value Ref Range Status   Specimen Description IN/OUT CATH URINE  Final   Special Requests NONE  Final   Culture   Final    NO GROWTH Performed at [facility], [place] [address], [place], [place] [address]    Report Status 07/05/2022 FINAL  Final  Blood Culture (routine x 2)     Status: None (Preliminary result)   Collection Time: 07/04/22  5:35 AM   Specimen: BLOOD LEFT FOREARM  Result Value Ref Range Status   Specimen Description BLOOD LEFT FOREARM  Final   Special Requests   Final    BOTTLES DRAWN AEROBIC AND ANAEROBIC Blood Culture adequate volume   Culture   Final    NO GROWTH 1 DAY Performed at [facility], [place] [address], [place], [place] [address]    Report Status PENDING  Incomplete  SARS Coronavirus 2 by RT PCR (hospital order, performed in [facility] hospital lab) *cepheid single result test* Anterior Nasal Swab     Status: None   Collection Time: 07/04/22  5:37 AM   Specimen: Anterior Nasal Swab  Result Value Ref Range Status   SARS Coronavirus 2 by RT PCR NEGATIVE NEGATIVE Final    Comment: (NOTE) SARS-CoV-2 target nucleic acids are NOT DETECTED.  The SARS-CoV-2 RNA is generally detectable in upper and lower respiratory specimens during the acute phase of infection. The lowest concentration of SARS-CoV-2 viral copies this assay can detect is 250 copies / mL. A negative result does not preclude SARS-CoV-2 infection and should not be used  as the sole basis for treatment or other patient management decisions.  A negative result may occur with improper specimen collection / handling, submission of specimen other than nasopharyngeal swab, presence of viral mutation(s) within the areas targeted by this assay, and inadequate number of viral copies (<250 copies / mL). A negative result must be combined with clinical observations, patient history, and epidemiological information.  Fact Sheet for Patients:   https://www.patel.info/  Fact Sheet for Healthcare Providers: https://hall.com/  This test is not yet approved or  cleared by the [place] FDA and has been authorized for detection and/or diagnosis of SARS-CoV-2 by FDA under an Emergency Use Authorization (EUA).  This EUA will remain in effect (meaning this test can be used) for the duration of the COVID-19 declaration under Section 564(b)(1) of the Act, 21 U.S.C. section 360bbb-3(b)(1), unless the authorization is terminated or revoked sooner.  Performed at [facility], [place] [address], [place], [place] [address]   CSF culture w Gram Stain     Status: None (Preliminary result)   Collection Time: 07/05/22 12:33 PM   Specimen: PATH Cytology CSF; Cerebrospinal Fluid  Result Value Ref Range Status   Specimen Description CSF  Final   Special Requests NONE  Final   Gram Stain   Final    NO ORGANISMS SEEN RARE WBC PRESENT, PREDOMINANTLY PMN Performed at [place] [address] [place], [place] [address]    Culture PENDING  Incomplete   Report Status PENDING  Incomplete      Studies: DG FL GUIDED LUMBAR PUNCTURE  Result Date: 07/05/2022 CLINICAL DATA:  71 year old male with new onset of seizures and altered mental status. Patient presents for lumbar puncture for further evaluation EXAM: DIAGNOSTIC LUMBAR PUNCTURE UNDER FLUOROSCOPIC GUIDANCE COMPARISON:  None Available. FLUOROSCOPY: Radiation Exposure  Index (as provided by the fluoroscopic device): 4.0 mGy Kerma PROCEDURE: Informed consent was obtained from the patient prior to the procedure, including potential complications of headache, allergy, and pain. With the patient prone, the lower back was prepped with Betadine. 1% Lidocaine was used for local anesthesia. Lumbar puncture was performed at the L4-5 level using a 20 3.5 gauge 3.6 inch needle with return of clear CSF. Six ml of CSF were obtained for laboratory studies. The patient began to move and the procedure was aborted for safety reasons. The patient tolerated the procedure well and there were no apparent complications. IMPRESSION: Technically successful lumbar puncture at L4-5 level, procedure aborted for safety reasons as the patient would not lie still and began to move after needle insertion. Electronically Signed   By: Zetta Bills M.D.   On: 07/05/2022 12:48   DG Chest [facility]  Result Date: 07/05/2022 CLINICAL DATA:  Fever EXAM: PORTABLE CHEST 1 VIEW COMPARISON:  None Available. FINDINGS: Limited study due to patient positioning/rotation. Airspace opacities in left mid to basilar lung. No visible pleural effusions or pneumothorax. Cardiomediastinal silhouette is accentuated by technique. No evidence of acute osseous abnormality. IMPRESSION: Limited study with airspace opacities in left mid to basilar lung, suspicious for pneumonia.Followup PA and lateral chest X-ray is recommended in 3-4 weeks following trial of antibiotic therapy to ensure resolution and exclude underlying malignancy. Electronically Signed   By: Margaretha Sheffield M.D.   On: 07/05/2022 08:09    Scheduled Meds:  docusate sodium  100 mg Oral BID   enoxaparin (LOVENOX) injection  40 mg Subcutaneous Q24H   memantine  10 mg Oral BID   rosuvastatin  10 mg Oral Daily   sodium chloride flush  3 mL Intravenous Q12H    Continuous Infusions:  ampicillin-sulbactam (UNASYN) IV     azithromycin     lactated ringers 75  mL/hr at 07/05/22 0153     LOS: 1 day     Alma Friendly, MD [facility]  If 7PM-7AM, please contact night-coverage [email or web address] 07/05/2022, 4:07 PM
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
## 2022-07-05 NOTE — Progress Notes (Addendum)
Pharmacy Antibiotic Note  Eddie Morris is a 71 y.o. male admitted on 07/04/2022 with AMS.  Pharmacy has been consulted  on 10/17 for Unasyn dosing for aspiration pneumonia.  Addendum  Unasyn>>Vanc/cefepime added empirically for sepsis. Messaged MD to add meningitis panel. Ok to add per Dr [name].  Plan:  Cefepime 2g IV q8 Vanc 1.25g IV q24>>AUC 418, scr 1.2 F/u with meningitis panel  Height: 6' (182.9 cm) Weight: 79.4 kg (175 lb) IBW/kg (Calculated) : 77.6  Temp (24hrs), Avg:99.7 F (37.6 C), Min:97.8 F (36.6 C), Max:101.6 F (38.7 C)  Recent Labs  Lab 07/04/22 0440 07/05/22 0403 07/05/22 0832  WBC 15.1*  --  12.3*  CREATININE 1.31* 1.21  --   LATICACIDVEN 1.6  --   --     Estimated Creatinine Clearance: 59.7 mL/min (by C-G formula based on SCr of 1.21 mg/dL).    Allergies  Allergen Reactions   Latex Rash    Antimicrobials this admission: Unasyn 10/17>>10/17 Vanc 10/17>> Azithr  10/16 >> Cefepime 10/16 x1 10/17>> Flagy 1016 x1   Dose adjustments this admission:   Microbiology results: 10/17 CSF cx : in process 10/16: Bcx x2:  ngtd 10/16 UCx: neg F   Onnie Boer, PharmD, BCIDP, AAHIVP, CPP Infectious Disease Pharmacist 07/05/2022 4:48 PM

## 2022-07-05 NOTE — Evaluation (Signed)
Physical Therapy Evaluation Patient Details Name: Eddie Morris MRN: [id number] DOB: 06/22/1951 Today's Date: 07/05/2022  History of Present Illness  Pt is a 71 y/o male who presented with gait abnormality and AMS. CT head negative for abnormalities. PMH: Lewy body dementia, HTN, CKD  Clinical Impression  Pt admitted with above diagnosis. Pt lethargic on eval. Not following any commands. Minimal verbalization. Did state that he was hungry when asked but then did not attempt to eat or accept food with mouth when brought near. Pt with some coughing during session so eating not pushed. Pt with tremors throughout UE's with PROM and stuffness noted LE's with PROM. Initiated move to EOB but pt not participating and very stiff throughout body. Instead bed placed in chair position for increased alertness. Pt maintained wakefulness but still unable to participate with activity. HR rising into 120's intermittently throughout session. Will place pt on 2x/wk trial to see if cognitive status changes and if he can participate.  Pt currently with functional limitations due to the deficits listed below (see PT Problem List). Pt will benefit from skilled PT to increase their independence and safety with mobility to allow discharge to the venue listed below.           Recommendations for follow up therapy are one component of a multi-disciplinary discharge planning process, led by the attending physician.  Recommendations may be updated based on patient status, additional functional criteria and insurance authorization.  Follow Up Recommendations Skilled nursing-short term rehab (<3 hours/day) Can patient physically be transported by private vehicle: No    Assistance Recommended at Discharge Frequent or constant Supervision/Assistance  Patient can return home with the following  Two people to help with walking and/or transfers;Two people to help with bathing/dressing/bathroom;Direct supervision/assist for  medications management;Direct supervision/assist for financial management;Assist for transportation;Help with stairs or ramp for entrance    Equipment Recommendations None recommended by PT  Recommendations for Other Services  OT consult    Functional Status Assessment Patient has had a recent decline in their functional status and demonstrates the ability to make significant improvements in function in a reasonable and predictable amount of time.     Precautions / Restrictions Precautions Precautions: Fall Restrictions Weight Bearing Restrictions: No Other Position/Activity Restrictions: watch HR      Mobility  Bed Mobility Overal bed mobility: Needs Assistance Bed Mobility: Supine to Sit     Supine to sit: Max assist     General bed mobility comments: initiated supine to sit but pt not participating and tremors increased with mvmt, even passive mvmt. Did not bring pt all the way to EOB because of this. Bed then placed in chair position. Pt breathing better in this position but upper body tremors continued and HR erractic into 120's with PVC's.    Transfers                   General transfer comment: unable to attempt today due to lethargy and cognitive status    Ambulation/Gait               General Gait Details: unable (of note, pt ambulated 100' with PT 8/23)  Stairs            Wheelchair Mobility    Modified Rankin (Stroke Patients Only)       Balance Overall balance assessment: Needs assistance Sitting-balance support: Single extremity supported, Feet supported Sitting balance-Leahy Scale: Poor Sitting balance - Comments: even in partial sitting pt needed max A to  maintain balance                                     Pertinent Vitals/Pain Pain Assessment Pain Assessment: Faces Faces Pain Scale: Hurts a little bit Pain Location: BLE's with mvmt Pain Descriptors / Indicators: Grimacing Pain Intervention(s): Limited  activity within patient's tolerance, Monitored during session    Home Living Family/patient expects to be discharged to:: Private residence Living Arrangements: Spouse/significant other Available Help at Discharge: Family Type of Home: House Home Access: Stairs to enter   [employer] of Steps: unable to specify   Home Layout: Two level Home Equipment: Conservation officer, nature (2 wheels) Additional Comments: all info from chart from previous admission. Pt not answering any questions    Prior Function Prior Level of Function : Needs assist;Patient poor historian/Family not available             Mobility Comments: per chart was ambulating with RW ADLs Comments: Per chart, pt able to bathe, dress self without difficulty     Hand Dominance   Dominant Hand: Right    Extremity/Trunk Assessment   Upper Extremity Assessment Upper Extremity Assessment: Defer to OT evaluation    Lower Extremity Assessment Lower Extremity Assessment: RLE deficits/detail;LLE deficits/detail;Generalized weakness;Difficult to assess due to impaired cognition RLE Deficits / Details: BLE's very stiff with PROM and pt grimaced with knee flexion. Pt did not initiate any LE mvmt on his own when cued LLE Deficits / Details: same as RLE    Cervical / Trunk Assessment Cervical / Trunk Assessment: Normal  Communication   Communication: Expressive difficulties;Receptive difficulties (slow and unclear speech)  Cognition Arousal/Alertness: Lethargic, Suspect due to medications Behavior During Therapy: Flat affect Overall Cognitive Status: Impaired/Different from baseline                                 General Comments: pt lethargic but able to be aroused. Did not answer any questions or follow any commands. Only verbalization is when asked if he was hungry he said, "yes, I'm hungry" but then did not initiate eating on his own or open mouth for fork when therapist assisted. Did not continue  with feeding since pt not participating.        General Comments General comments (skin integrity, edema, etc.): no family present. Pt still awake end of session but not focusing on therapist when his name was called and not attempting to verbalize    Exercises     Assessment/Plan    PT Assessment Patient needs continued PT services  PT Problem List Decreased activity tolerance;Decreased balance;Decreased mobility;Decreased coordination;Decreased cognition;Decreased knowledge of use of DME;Decreased safety awareness;Decreased range of motion;Decreased strength;Cardiopulmonary status limiting activity;Decreased knowledge of precautions       PT Treatment Interventions DME instruction;Gait training;Functional mobility training;Therapeutic activities;Therapeutic exercise;Balance training;Neuromuscular re-education;Patient/family education    PT Goals (Current goals can be found in the Care Plan section)  Acute Rehab PT Goals Patient Stated Goal: none stated PT Goal Formulation: Patient unable to participate in goal setting Time For Goal Achievement: 07/19/22 Potential to Achieve Goals: Fair    Frequency Min 2X/week (trial to see if pt can participate)     Co-evaluation               AM-PAC PT "6 Clicks" Mobility  Outcome Measure Help needed turning from your back to your  side while in a flat bed without using bedrails?: Total Help needed moving from lying on your back to sitting on the side of a flat bed without using bedrails?: Total Help needed moving to and from a bed to a chair (including a wheelchair)?: Total Help needed standing up from a chair using your arms (e.g., wheelchair or bedside chair)?: Total Help needed to walk in hospital room?: Total Help needed climbing 3-5 steps with a railing? : Total 6 Click Score: 6    End of Session   Activity Tolerance: Patient limited by lethargy Patient left: in bed;with call bell/phone within reach;with bed alarm  set Nurse Communication: Mobility status PT Visit Diagnosis: Unsteadiness on feet (R26.81);Difficulty in walking, not elsewhere classified (R26.2);Muscle weakness (generalized) (M62.81)    Time: [id number] PT Time Calculation (min) (ACUTE ONLY): 19 min   Charges:   PT Evaluation $PT Eval Moderate Complexity: [place] chat preferred Office [place] 07/05/2022, 4:41 PM

## 2022-07-05 NOTE — Progress Notes (Signed)
   07/05/22 1553  Assess: MEWS Score  Temp (!) 103 F (39.4 C)  BP 136/83  MAP (mmHg) 93  Pulse Rate (!) 108  ECG Heart Rate (!) 109  Resp (!) 26  SpO2 93 %  Assess: MEWS Score  MEWS Temp 2  MEWS Systolic 0  MEWS Pulse 1  MEWS RR 2  MEWS LOC 0  MEWS Score 5  MEWS Score Color Red  Assess: if the MEWS score is Yellow or Red  Were vital signs taken at a resting state? Yes  Focused Assessment No change from prior assessment  Does the patient meet 2 or more of the SIRS criteria? Yes  Does the patient have a confirmed or suspected source of infection? Yes  Provider and Rapid Response Notified? Yes  MEWS guidelines implemented *See Row Information* Yes  Treat  MEWS Interventions Administered prn meds/treatments  Pain Scale 0-10  Pain Score 0  Take Vital Signs  Increase Vital Sign Frequency  Red: Q 1hr X 4 then Q 4hr X 4, if remains red, continue Q 4hrs  Escalate  MEWS: Escalate Red: discuss with charge nurse/RN and provider, consider discussing with RRT  Notify: Charge Nurse/RN  Name of Charge Nurse/RN Notified Delcine RN  Date Charge Nurse/RN Notified 07/05/22  Time Charge Nurse/RN Notified 1615  Notify: Provider  Provider Name/Title Dr. [name]  Date Provider Notified 07/05/22  Time Provider Notified 1620  Method of Notification Page  Notification Reason Change in status  Provider response See new orders  Date of Provider Response 07/05/22  Time of Provider Response 1630  Assess: SIRS CRITERIA  SIRS Temperature  1  SIRS Pulse 1  SIRS Respirations  1  SIRS WBC 1  SIRS Score Sum  4

## 2022-07-06 ENCOUNTER — Inpatient Hospital Stay (HOSPITAL_COMMUNITY): Payer: Medicare PPO

## 2022-07-06 DIAGNOSIS — G9341 Metabolic encephalopathy: Secondary | ICD-10-CM | POA: Diagnosis not present

## 2022-07-06 LAB — URINALYSIS, ROUTINE W REFLEX MICROSCOPIC
Bilirubin Urine: NEGATIVE
Glucose, UA: NEGATIVE mg/dL
Ketones, ur: 20 mg/dL — AB
Leukocytes,Ua: NEGATIVE
Nitrite: NEGATIVE
Protein, ur: 100 mg/dL — AB
Specific Gravity, Urine: 1.021 (ref 1.005–1.030)
pH: 5 (ref 5.0–8.0)

## 2022-07-06 LAB — CBC WITH DIFFERENTIAL/PLATELET
Abs Immature Granulocytes: 0.06 10*3/uL (ref 0.00–0.07)
Basophils Absolute: 0 10*3/uL (ref 0.0–0.1)
Basophils Relative: 0 %
Eosinophils Absolute: 0 10*3/uL (ref 0.0–0.5)
Eosinophils Relative: 0 %
HCT: 44.2 % (ref 39.0–52.0)
Hemoglobin: 15.2 g/dL (ref 13.0–17.0)
Immature Granulocytes: 1 %
Lymphocytes Relative: 5 %
Lymphs Abs: 0.7 10*3/uL (ref 0.7–4.0)
MCH: 31.8 pg (ref 26.0–34.0)
MCHC: 34.4 g/dL (ref 30.0–36.0)
MCV: 92.5 fL (ref 80.0–100.0)
Monocytes Absolute: 1.1 10*3/uL — ABNORMAL HIGH (ref 0.1–1.0)
Monocytes Relative: 9 %
Neutro Abs: 10.8 10*3/uL — ABNORMAL HIGH (ref 1.7–7.7)
Neutrophils Relative %: 85 %
Platelets: 127 10*3/uL — ABNORMAL LOW (ref 150–400)
RBC: 4.78 MIL/uL (ref 4.22–5.81)
RDW: 13.2 % (ref 11.5–15.5)
WBC: 12.6 10*3/uL — ABNORMAL HIGH (ref 4.0–10.5)
nRBC: 0 % (ref 0.0–0.2)

## 2022-07-06 LAB — BASIC METABOLIC PANEL
Anion gap: 15 (ref 5–15)
BUN: 16 mg/dL (ref 8–23)
CO2: 21 mmol/L — ABNORMAL LOW (ref 22–32)
Calcium: 9 mg/dL (ref 8.9–10.3)
Chloride: 104 mmol/L (ref 98–111)
Creatinine, Ser: 1.46 mg/dL — ABNORMAL HIGH (ref 0.61–1.24)
GFR, Estimated: 50 mL/min — ABNORMAL LOW (ref >60)
Glucose, Bld: 100 mg/dL — ABNORMAL HIGH (ref 70–99)
Potassium: 3.3 mmol/L — ABNORMAL LOW (ref 3.5–5.1)
Sodium: 140 mmol/L (ref 135–145)

## 2022-07-06 LAB — PROCALCITONIN: Procalcitonin: 3.17 ng/mL

## 2022-07-06 MED ORDER — SODIUM CHLORIDE 0.9 % IV SOLN: 2.0000 g | Freq: Two times a day (BID) | INTRAVENOUS | Status: DC

## 2022-07-06 MED ORDER — POTASSIUM CHLORIDE 10 MEQ/100ML IV SOLN
10.0000 meq | INTRAVENOUS | Status: AC
  Administered 2022-07-06 (×3): 10 meq via INTRAVENOUS
  Filled 2022-07-06 (×3): qty 100

## 2022-07-06 MED ORDER — SODIUM CHLORIDE 0.9 % IV SOLN
3.0000 g | Freq: Three times a day (TID) | INTRAVENOUS | Status: DC
  Administered 2022-07-06 – 2022-07-08 (×6): 3 g via INTRAVENOUS
  Filled 2022-07-06 (×5): qty 8

## 2022-07-06 NOTE — Progress Notes (Signed)
Subjective: Continues to be confused  Exam: Vitals:   07/06/22 1001 07/06/22 1200  BP:  139/68  Pulse: 100 (!) 101  Resp: 20 20  Temp: (!) 102.8 F (39.3 C) (!) 100.7 F (38.2 C)  SpO2:  90%   Gen: In bed, NAD Resp: non-labored breathing, no acute distress Abd: soft, nt  Neuro: MS: Awake, able to tell me his name but not able to answer other orientation questions.  He is able to follow commands. CN: He has difficulty counting fingers in all visual fields, I have a difficult time teasing out if he does have any degree of left-sided field cut. Motor: No definite weakness today, no drift. Sensory: Report symmetric sensation  Pertinent Labs: Procalcitonin 3.17 Meningoencephalitis panel-negative including HSV Protein was slightly elevated at 69, though I would consider this nonspecific   Impression: 71 year old male with a history of dementia who presents with encephalopathy and possible seizure in the setting of acute illness.  Given his hypoxemia, and other acute issues, agree with not starting antiepileptics at the current time given that this could be a provoked seizure.  CSF is negative for infection, if MRI is negative, would just treat his underlying infection, will treat other evidence of infection per internal medicine.  Recommendations: 1) MRI brain 2) no further recommendations if MRI is negative.  If MRI is negative, would treat as an septic encephalopathy/delirium superimposed on dementia 3) neurology will be available as needed.  Roland Rack, MD [facility] [phone]  If 7pm- 7am, please page neurology on call as listed in [place].

## 2022-07-06 NOTE — Progress Notes (Signed)
MRI without acute findings. Findings suspicious for amyloid angiopathy,which can be seen in the setting of dementia. No current changes based on this, but full dose anticoagulation is contraindicated moving forward based on this MRI without a very strong indication with risk/benefit considerations.   We will be available as needed.   Roland Rack, MD [facility] [phone]  If 7pm- 7am, please page neurology on call as listed in [place].
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
## 2022-07-06 NOTE — Plan of Care (Signed)
  Problem: Education: Goal: Knowledge of General Education information will improve Description: Including pain rating scale, medication(s)/side effects and non-pharmacologic comfort measures Outcome: Progressing   Problem: Clinical Measurements: Goal: Ability to maintain clinical measurements within normal limits will improve Outcome: Progressing   Problem: Coping: Goal: Level of anxiety will decrease Outcome: Progressing   Problem: Elimination: Goal: Will not experience complications related to bowel motility Outcome: Progressing   

## 2022-07-06 NOTE — Progress Notes (Signed)
Initial Nutrition Assessment  DOCUMENTATION CODES:   Not applicable  INTERVENTION:   Recommend placement of Cortrak if within [facility] to meet pt nutritional needs.  Start Jevity 1.5 at 25 mL/hr and advance by 10 mL q6h to goal rate of 55 mL/hr (1320 mL/day) ProSource TF20 - daily 170 mL free water flush q4h  Provides 2060 kcal, 104 gm protein, and 2023 mL total free water daily.   NUTRITION DIAGNOSIS:   Inadequate oral intake related to inability to eat as evidenced by NPO status.  GOAL:   Patient will meet greater than or equal to 90% of their needs  MONITOR:   Diet advancement, Labs, I & O's, Weight trends  REASON FOR ASSESSMENT:   Consult Other (Comment) (Nutritional Goals)  ASSESSMENT:   71 y.o. male presented to the ED with AMS. PMH includes HTN, CKD IIIa, and dementia. Pt admitted with acute metabolic encephalopathy, possible seizures, and sepsis.   Pt in bed, NT at bedside preforming pt care. Pt does not look at RD when calling name or touching pt. No family at bedside. Discussed with RN and SLP, both do not feel confident that PO would be good option for him at this point. Reached out to MD to discuss placement of Cortrak and starting enteral nutrition if within [facility] [facility]. No response from MD.   Medications reviewed and include: Colace, IV antibiotics  Labs reviewed: Potassium 3.3, Creatinine 1.46  NUTRITION - FOCUSED PHYSICAL EXAM:  Deferred to follow-up.   Diet Order:   Diet Order             Diet NPO time specified Except for: Ice Chips  Diet effective now                   EDUCATION NEEDS:   Not appropriate for education at this time  Skin:  Skin Assessment: Reviewed RN Assessment  Last BM:  Unknown  Height:   Ht Readings from Last 1 Encounters:  07/04/22 6' (1.829 m)    Weight:   Wt Readings from Last 1 Encounters:  07/04/22 79.4 kg    Ideal Body Weight:  80.9 kg  BMI:  Body mass index is 23.73 kg/m.  Estimated Nutritional Needs:    Kcal:  2000-2200  Protein:  100-115 grams  Fluid:  >/= 2 L    Hermina Barters RD, LDN Clinical Dietitian See [facility] for contact information.

## 2022-07-06 NOTE — Progress Notes (Signed)
  Transition of Care (TOC) Screening Note   Patient Details  Name: Eddie Morris Date of Birth: 12/25/1950   Transition of Care [facility]) CM/SW Contact:    Cyndi Bender, RN Phone Number: 07/06/2022, 9:30 AM    Transition of Care Department [facility]) has reviewed patient and no TOC needs have been identified at this time. We will continue to monitor patient advancement through interdisciplinary progression rounds. If new patient transition needs arise, please place a TOC consult.
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
## 2022-07-06 NOTE — Progress Notes (Signed)
PROGRESS NOTE  Eddie Morris [id number] DOB: 13-Oct-1950 DOA: 07/04/2022 PCP: Seward Carol, MD  HPI/Recap of past 24 hours: Eddie Morris is a 71 y.o. male with medical history significant of HTN, HLD, stage 3a CKD, and dementia presenting with AMS.  His wife reported that he was more altered than his usual baseline for the last day or two.  He experienced acute onset of GTC seizure activity in triage with sats dropping to the 70s, some periodic small myoclonic jerks. He also had an episode of about ?6-second pause followed by gradual return to HR in the 60s. At baseline, he is able to to dress himself, sometimes needs assistance and mostly independent with his ADLs. He mostly struggles with his reasoning and logic. In the ED, seizure-like episode in triage with apnea, O2 sat 70s. T102 following seizure. Code sepsis activated.  Patient admitted for further management.    10/18 febrile this am 102.8. ordered cxr, bcx x2. When waking him up, eyes open, start with little hand tremors upon waking up.doesn't respond to my questions, but eyes open.   Assessment/Plan: Principal Problem:   Acute metabolic encephalopathy Active Problems:   Chronic kidney disease, stage 3a (HCC)   Dementia with behavioral disturbance (HCC)   Essential hypertension   Pure hypercholesterolemia   Seizure-like activity (HCC)   Sinus pause   Possible seizure Patient with an episode GTC activity in triage followed by temp 102 EEG showed no seizure or epileptiform discharges CT head unremarkable Discussed with neurology Dr. Leonel Ramsay, will follow Fall precautions/seizure precautions 10/18 neurology recommended MRI brain No further recommendations if MRI is negative.  If MRI is negative will treat as septic encephalopathy/delirium superimposed on dementia   Sepsis likely 2/2 aspiration pneumonia vs UTI Noted fever, tachycardia, leukocytosis Fever occurred shortly after seizure-like activity,  ??Aspiration pneumonia Lactic acid unremarkable Procalcitonin pending LP done on 10/17, cell count currently unremarkable UA showed trace leukocyte, UC pending BC x2 pending, CSF culture pending Repeat chest x-ray showed airspace opacity in the left mid to basilar lung suspicious for pneumonia CTA abdomen/pelvis showed only diffuse bladder wall thickening which is nonspecific and can be seen in UTI, otherwise unremarkable Gentle IV hydration Start broad-spectrum, vancomycin, cefepime, azithromycin, Flagyl, plan to de-escalate pending clinical picture 10/18 still febrile.  Cefepime increases risk of encephalopathy will switch to Unasyn to cover for anaerobes.  Febrile this a.m. repeat chest x-ray and blood cultures ID consulted   Acute metabolic encephalopathy Underlying dementia Likely 2/2 above 10/18 management as above  Sinus pause Noted an episode of pause for about 6 seconds on monitor Troponin negative EKG with no acute ST changes, noted PVCs Placed on telemetry Consult cardiology if reoccurs 10/18 holding Aricept  HTN Hold amlodipine, resume when able IV hydralazine prn   HLD Resume Crestor once able   Stage 3a CKD Stable Daily BMP   Dementia Continue Namenda, hold Aricept as above Delirium precautions Haldol prn for severe agitation      Estimated body mass index is 23.73 kg/m as calculated from the following:   Height as of this encounter: 6' (1.829 m).   Weight as of this encounter: 79.4 kg.     Code Status: DNR  Family Communication: None at bedside  Disposition Plan: Status is: Inpatient Remains inpatient appropriate because: Level of care due to IV treatment.  Still febrile.  Work-up pending      Consultants: Neurology  Procedures: None  Antimicrobials: Vancomycin Cefepime Azithromycin Flagyl  DVT prophylaxis: Lovenox   Subjective  Opens eyes, doesn't rally follow command.   Objective: Vitals:   07/06/22 0800 07/06/22 0845  07/06/22 1001 07/06/22 1200  BP: (!) 171/153 (!) 154/74  139/68  Pulse: 72  100 (!) 101  Resp: 19  20 20   Temp: 100.1 F (37.8 C)  (!) 102.8 F (39.3 C) (!) 100.7 F (38.2 C)  TempSrc:   Axillary Axillary  SpO2: 94%   90%  Weight:      Height:        Intake/Output Summary (Last 24 hours) at 07/06/2022 1409 Last data filed at 07/05/2022 1819 Gross per 24 hour  Intake 1108.7 ml  Output --  Net 1108.7 ml   Filed Weights   07/04/22 0500  Weight: 79.4 kg    Exam: Calm, NAD, appears confused        Coars bronchial bs, no wheezing Reg s1/s2 no gallop Soft benign +bs No edema Awakes, starts fine tremors on waking, not following command, just staring Mood and affect appropriate in current setting     Data Reviewed: CBC: Recent Labs  Lab 07/04/22 0440 07/05/22 0832 07/06/22 0232  WBC 15.1* 12.3* 12.6*  NEUTROABS 11.6*  --  10.8*  HGB 14.2 10.9* 15.2  HCT 41.2 31.7* 44.2  MCV 93.4 93.5 92.5  PLT 193 129* [id number]*   Basic Metabolic Panel: Recent Labs  Lab 07/04/22 0440 07/05/22 0403 07/06/22 0232  NA 139 140 140  K 3.5 4.2 3.3*  CL 106 107 104  CO2 24 17* 21*  GLUCOSE 127* 83 100*  BUN 11 11 16   CREATININE 1.31* 1.21 1.46*  CALCIUM 9.3 8.8* 9.0   GFR: Estimated Creatinine Clearance: 49.5 mL/min (A) (by C-G formula based on SCr of 1.46 mg/dL (H)). Liver Function Tests: Recent Labs  Lab 07/04/22 0440  AST 18  ALT 11  ALKPHOS 59  BILITOT 1.4*  PROT 7.3  ALBUMIN 3.9   No results for input(s): "LIPASE", "AMYLASE" in the last 168 hours. No results for input(s): "AMMONIA" in the last 168 hours. Coagulation Profile: Recent Labs  Lab 07/04/22 0440  INR 1.1   Cardiac Enzymes: No results for input(s): "CKTOTAL", "CKMB", "CKMBINDEX", "TROPONINI" in the last 168 hours. BNP (last 3 results) No results for input(s): "PROBNP" in the last 8760 hours. HbA1C: No results for input(s): "HGBA1C" in the last 72 hours. CBG: Recent Labs  Lab 07/04/22 0703   GLUCAP 117*   Lipid Profile: No results for input(s): "CHOL", "HDL", "LDLCALC", "TRIG", "CHOLHDL", "LDLDIRECT" in the last 72 hours. Thyroid Function Tests: No results for input(s): "TSH", "T4TOTAL", "FREET4", "T3FREE", "THYROIDAB" in the last 72 hours. Anemia Panel: No results for input(s): "VITAMINB12", "FOLATE", "FERRITIN", "TIBC", "IRON", "RETICCTPCT" in the last 72 hours. Urine analysis:    Component Value Date/Time   COLORURINE YELLOW 07/04/2022 0505   APPEARANCEUR HAZY (A) 07/04/2022 0505   LABSPEC 1.020 07/04/2022 0505   PHURINE 5.0 07/04/2022 0505   GLUCOSEU NEGATIVE 07/04/2022 0505   HGBUR NEGATIVE 07/04/2022 0505   BILIRUBINUR NEGATIVE 07/04/2022 0505   KETONESUR 20 (A) 07/04/2022 0505   PROTEINUR 30 (A) 07/04/2022 0505   NITRITE NEGATIVE 07/04/2022 0505   LEUKOCYTESUR TRACE (A) 07/04/2022 0505   Sepsis Labs: @LABRCNTIP (procalcitonin:4,lacticidven:4)  ) Recent Results (from the past 240 hour(s))  Urine Culture     Status: None   Collection Time: 07/04/22  5:05 AM   Specimen: In/Out Cath Urine  Result Value Ref Range Status   Specimen Description IN/OUT CATH URINE  Final   Special Requests NONE  Final   Culture   Final    NO GROWTH Performed at [facility], [place] [address], [place], [place] [address]    Report Status 07/05/2022 FINAL  Final  Blood Culture (routine x 2)     Status: None (Preliminary result)   Collection Time: 07/04/22  5:35 AM   Specimen: BLOOD LEFT FOREARM  Result Value Ref Range Status   Specimen Description BLOOD LEFT FOREARM  Final   Special Requests   Final    BOTTLES DRAWN AEROBIC AND ANAEROBIC Blood Culture adequate volume   Culture   Final    NO GROWTH 2 DAYS Performed at [facility], [place] [address] [place], [place] [address]    Report Status PENDING  Incomplete  SARS Coronavirus 2 by RT PCR (hospital order, performed in [facility] hospital lab) *cepheid single result test* Anterior Nasal Swab     Status: None    Collection Time: 07/04/22  5:37 AM   Specimen: Anterior Nasal Swab  Result Value Ref Range Status   SARS Coronavirus 2 by RT PCR NEGATIVE NEGATIVE Final    Comment: (NOTE) SARS-CoV-2 target nucleic acids are NOT DETECTED.  The SARS-CoV-2 RNA is generally detectable in upper and lower respiratory specimens during the acute phase of infection. The lowest concentration of SARS-CoV-2 viral copies this assay can detect is 250 copies / mL. A negative result does not preclude SARS-CoV-2 infection and should not be used as the sole basis for treatment or other patient management decisions.  A negative result may occur with improper specimen collection / handling, submission of specimen other than nasopharyngeal swab, presence of viral mutation(s) within the areas targeted by this assay, and inadequate number of viral copies (<250 copies / mL). A negative result must be combined with clinical observations, patient history, and epidemiological information.  Fact Sheet for Patients:   https://www.patel.info/  Fact Sheet for Healthcare Providers: https://hall.com/  This test is not yet approved or  cleared by the [place] FDA and has been authorized for detection and/or diagnosis of SARS-CoV-2 by FDA under an Emergency Use Authorization (EUA).  This EUA will remain in effect (meaning this test can be used) for the duration of the COVID-19 declaration under Section 564(b)(1) of the Act, 21 U.S.C. section 360bbb-3(b)(1), unless the authorization is terminated or revoked sooner.  Performed at [facility], [place] [address], [place], [place] [address]   CSF culture w Gram Stain     Status: None (Preliminary result)   Collection Time: 07/05/22 12:33 PM   Specimen: PATH Cytology CSF; Cerebrospinal Fluid  Result Value Ref Range Status   Specimen Description CSF  Final   Special Requests NONE  Final   Gram Stain   Final    NO ORGANISMS  SEEN RARE WBC PRESENT, PREDOMINANTLY PMN    Culture   Final    NO GROWTH < 24 HOURS Performed at [place] [address] [place], [place] [address]    Report Status PENDING  Incomplete  Culture, fungus without smear     Status: None (Preliminary result)   Collection Time: 07/05/22 12:33 PM   Specimen: PATH Cytology CSF; Cerebrospinal Fluid  Result Value Ref Range Status   Specimen Description CSF  Final   Special Requests NONE  Final   Culture   Final    NO FUNGUS ISOLATED AFTER 1 DAY Performed at [place] [address] [place], [place] [address]    Report  Status PENDING  Incomplete      Studies: DG CHEST PORT 1 VIEW  Result Date: 07/06/2022 CLINICAL DATA:  Fever. EXAM: PORTABLE CHEST 1 VIEW COMPARISON:  One-view chest x-ray 07/05/2022. FINDINGS: The heart is enlarged, exaggerated by low lung volumes. Atherosclerotic calcifications are again noted at the aortic arch. Progressive lingular and left lower lobe airspace disease is present. A new left pleural effusion is present. Right lung is clear. IMPRESSION: 1. Progressive lingular and left lower lobe airspace disease with new left pleural effusion. Findings are concerning for pneumonia. 2. Cardiomegaly without failure. Electronically Signed   By: San Morelle M.D.   On: 07/06/2022 13:30    Scheduled Meds:  docusate sodium  100 mg Oral BID   enoxaparin (LOVENOX) injection  40 mg Subcutaneous Q24H   memantine  10 mg Oral BID   rosuvastatin  10 mg Oral Daily   sodium chloride flush  3 mL Intravenous Q12H    Continuous Infusions:  acetaminophen 1,000 mg (07/06/22 0406)   ampicillin-sulbactam (UNASYN) IV     azithromycin 500 mg (07/05/22 1609)   lactated ringers 75 mL/hr at 07/05/22 0153   vancomycin 1,250 mg (07/05/22 1807)     LOS: 2 days     Nolberto Hanlon, MD [facility]  If 7PM-7AM, please contact night-coverage [email or web address] 07/06/2022, 2:09 PM
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
## 2022-07-06 NOTE — Progress Notes (Signed)
Pharmacy Antibiotic Note  Eddie Morris is a 71 y.o. male admitted on 07/04/2022 with AMS.  Pharmacy has been consulted  on 10/17 for Unasyn dosing for aspiration pneumonia.  Due to his encephalopathy, we will change his cefepime/flagyl to Unasyn after discussing with Dr. Kurtis Bushman. Messaged nursing staff to collect MRSA PCR to de-escalate further.  Plan: Dc cefepime/flagyl Unasyn 3g IVq8 Vanc 1.25g IV q24>>AUC 483, scr 1.46   Height: 6' (182.9 cm) Weight: 79.4 kg (175 lb) IBW/kg (Calculated) : 77.6  Temp (24hrs), Avg:100.9 F (38.3 C), Min:98.2 F (36.8 C), Max:103.4 F (39.7 C)  Recent Labs  Lab 07/04/22 0440 07/05/22 0403 07/05/22 0832 07/06/22 0232  WBC 15.1*  --  12.3* 12.6*  CREATININE 1.31* 1.21  --  1.46*  LATICACIDVEN 1.6  --   --   --      Estimated Creatinine Clearance: 49.5 mL/min (A) (by C-G formula based on SCr of 1.46 mg/dL (H)).    Allergies  Allergen Reactions   Latex Rash    Antimicrobials this admission: Unasyn 10/18>> Vanc 10/17>> Azithr 10/16 >> Cefepime 10/16 x1 10/17>>10/18 Flagyl 1016 >>10/18   Dose adjustments this admission:   Microbiology results: 10/17 CSF cx : ngtd 10/16: Bcx x2:  ngtd 10/16 UCx: neg F Meningitis panel neg   Onnie Boer, PharmD, BCIDP, AAHIVP, CPP Infectious Disease Pharmacist 07/06/2022 2:04 PM

## 2022-07-06 NOTE — Evaluation (Signed)
Occupational Therapy Evaluation Patient Details Name: Eddie Morris MRN: [id number] DOB: 1951/03/01 Today's Date: 07/06/2022   History of Present Illness Pt is a 71 y/o male who presented with gait abnormality and AMS. CT head negative for abnormalities. PMH: Lewy body dementia, HTN, CKD   Clinical Impression   Prior to this admission, patient living at home with his wife and independent in basic ADLs, and ambulating with a walker (information gleaned from chart due to patient being a poor historian currently). Currently, patient not following any commands, tremors noted with all movement attempts, total A to reposition in bed and for all aspects of care, and placed in chair position due to congestion noted with coughing and in hopes to improve mentation. OT recommending SNF placement due to patients current level of needs; OT will continue to follow.      Recommendations for follow up therapy are one component of a multi-disciplinary discharge planning process, led by the attending physician.  Recommendations may be updated based on patient status, additional functional criteria and insurance authorization.   Follow Up Recommendations  Skilled nursing-short term rehab (<3 hours/day)    Assistance Recommended at Discharge Frequent or constant Supervision/Assistance  Patient can return home with the following Two people to help with walking and/or transfers;Two people to help with bathing/dressing/bathroom;Assistance with cooking/housework;Assistance with feeding;Direct supervision/assist for medications management;Direct supervision/assist for financial management;Assist for transportation;Help with stairs or ramp for entrance    Functional Status Assessment  Patient has had a recent decline in their functional status and demonstrates the ability to make significant improvements in function in a reasonable and predictable amount of time.  Equipment Recommendations  Other (comment)  (Defer to next venue)    Recommendations for Other Services       Precautions / Restrictions Precautions Precautions: Fall Restrictions Weight Bearing Restrictions: No Other Position/Activity Restrictions: watch HR      Mobility Bed Mobility Overal bed mobility: Needs Assistance Bed Mobility: Rolling, Supine to Sit Rolling: Total assist, +2 for physical assistance, +2 for safety/equipment   Supine to sit: Total assist, +2 for physical assistance, +2 for safety/equipment     General bed mobility comments: Patient not following any commands, tremors noted with all movement attempts, total A to reposition in bed, placed in chair position due to congestion noted with coughing and in hopes to improve mentation    Transfers Overall transfer level: Needs assistance                 General transfer comment: unable to attempt today due to lethargy and cognitive status      Balance Overall balance assessment: Needs assistance     Sitting balance - Comments: total A currently                                   ADL either performed or assessed with clinical judgement   ADL Overall ADL's : Needs assistance/impaired                                     Functional mobility during ADLs: Total assistance;+2 for physical assistance;+2 for safety/equipment;Cueing for safety;Cueing for sequencing General ADL Comments: Patient is currently total A for all aspects of care, no following of commands to participate in basic ADLs     Vision Baseline Vision/History: 1 Wears glasses Patient Visual Report:  (  Unable to assess) Vision Assessment?:  (Unable to assess) Additional Comments: Unable to assess due to mentation     Perception     Praxis      Pertinent Vitals/Pain Pain Assessment Pain Assessment: Faces Faces Pain Scale: Hurts a little bit Pain Location: generalized with movement Pain Descriptors / Indicators: Discomfort, Guarding,  Grimacing Pain Intervention(s): Limited activity within patient's tolerance, Monitored during session, Repositioned     Hand Dominance Right   Extremity/Trunk Assessment Upper Extremity Assessment Upper Extremity Assessment: Generalized weakness (Tremulous with all movement)   Lower Extremity Assessment Lower Extremity Assessment: Defer to PT evaluation       Communication Communication Communication: Expressive difficulties;Receptive difficulties (garbled speech)   Cognition Arousal/Alertness: Lethargic Behavior During Therapy: Flat affect Overall Cognitive Status: Impaired/Different from baseline Area of Impairment: Orientation, Attention, Memory, Following commands, Safety/judgement, Awareness, Problem solving                 Orientation Level: Place, Time, Situation Current Attention Level: Focused Memory: Decreased recall of precautions, Decreased short-term memory Following Commands: Follows one step commands inconsistently Safety/Judgement: Decreased awareness of safety, Decreased awareness of deficits Awareness: Intellectual Problem Solving: Slow processing, Decreased initiation, Difficulty sequencing, Requires verbal cues, Requires tactile cues General Comments: Patient minimally alert in session, oriented to self only, unable to follow commands despite hand over hand assist     General Comments       Exercises     Shoulder Instructions      Home Living Family/patient expects to be discharged to:: Private residence Living Arrangements: Spouse/significant other Available Help at Discharge: Family Type of Home: House Home Access: Stairs to enter [employer] of Steps: unable to specify   Home Layout: Two level               Home Equipment: Conservation officer, nature (2 wheels)   Additional Comments: all info from chart from previous admission. Pt not answering any questions      Prior Functioning/Environment Prior Level of Function : Needs  assist;Patient poor historian/Family not available             Mobility Comments: per chart was ambulating with RW ADLs Comments: Per chart, pt able to bathe, dress self without difficulty        OT Problem List: Decreased strength;Decreased activity tolerance;Impaired balance (sitting and/or standing);Decreased cognition;Decreased coordination;Decreased safety awareness;Decreased knowledge of use of DME or AE;Decreased knowledge of precautions;Impaired UE functional use;Pain      OT Treatment/Interventions: Self-care/ADL training;Therapeutic exercise;Energy conservation;DME and/or AE instruction;Manual therapy;Cognitive remediation/compensation;Therapeutic activities;Balance training;Patient/family education    OT Goals(Current goals can be found in the care plan section) Acute Rehab OT Goals Patient Stated Goal: unable to participate OT Goal Formulation: Patient unable to participate in goal setting Time For Goal Achievement: 07/20/22 Potential to Achieve Goals: Poor ADL Goals Pt Will Perform Grooming: with set-up;sitting Pt Will Transfer to Toilet: with mod assist;bedside commode Pt Will Perform Toileting - Clothing Manipulation and hygiene: with mod assist;sitting/lateral leans;sit to/from stand Additional ADL Goal #1: Patient will be able to sit EOB with min external support for 3-5 minutes in preparation for higher level ADLs and functional activities. Additional ADL Goal #2: Patient will be able to follow 1 step commands consistently in preparation for ADL management.  OT Frequency: Min 2X/week    Co-evaluation              AM-PAC OT "6 Clicks" Daily Activity     Outcome Measure Help from another person eating meals?:  Total Help from another person taking care of personal grooming?: Total Help from another person toileting, which includes using toliet, bedpan, or urinal?: Total Help from another person bathing (including washing, rinsing, drying)?: Total Help from  another person to put on and taking off regular upper body clothing?: Total Help from another person to put on and taking off regular lower body clothing?: Total 6 Click Score: 6   End of Session Nurse Communication: Mobility status;Other (comment) (chair position)  Activity Tolerance: Patient limited by fatigue;Patient limited by lethargy Patient left: in bed;with call bell/phone within reach;with bed alarm set (chair position)  OT Visit Diagnosis: Unsteadiness on feet (R26.81);Other abnormalities of gait and mobility (R26.89);Muscle weakness (generalized) (M62.81);Other symptoms and signs involving cognitive function;Adult, failure to thrive (R62.7)                Time: 4431-5400 OT Time Calculation (min): 10 min Charges:  OT General Charges $OT Visit: 1 Visit OT Evaluation $OT Eval Moderate Complexity: 1 Mod  Pollyann Glen E. Zachary Nole, OTR/L Acute Rehabilitation Services [phone]   Cherlyn Cushing 07/06/2022, 9:27 AM

## 2022-07-06 NOTE — TOC Progression Note (Signed)
Transition of Care [facility]) - Progression Note    Patient Details  Name: Eddie Morris MRN: [id number] Date of Birth: [birth year]  Transition of Care [facility]) CM/SW [place], [place] Work Phone Number: 07/06/2022, 1:58 PM  Clinical Narrative:    MSW intern spoke with patient's wife, Eddie Morris, via phone regarding the discharge plan for her husband and what her thoughts were regarding SNF or home with home health. MSW intern advised [place] occupational therapy has been working with patient and are recommending SNF. Eddie Morris stated she was not sure what the best place to send him was at this time because she was not sure how patient's health would be when it was time for him to discharge. MSW intern advised Eddie Morris when looking at a SNF, Medicare typically only pays for 20 days and then the patient would go into copay days where they would pay approximately $190 per day. Eddie Morris stated patient was recently at [facility] from August 22 until September 3 which puts him at using 13 of his days. Eddie Morris stated they are not able to pay the copay amount if he was at a SNF longer then 7 days and have previously checked into Medicaid which they do not qualify for. Debbie advised because of patient's dementia diagnosis, she does not know what the best plan for him is as he has good days and bad, and plans to go and visit patient today which will help her to determine the right fit for him.         Expected Discharge Plan and Services                                                 Social Determinants of Health (SDOH) Interventions    Readmission Risk Interventions     No data to display

## 2022-07-07 ENCOUNTER — Inpatient Hospital Stay (HOSPITAL_COMMUNITY): Payer: Medicare PPO

## 2022-07-07 DIAGNOSIS — E876 Hypokalemia: Secondary | ICD-10-CM

## 2022-07-07 DIAGNOSIS — G9341 Metabolic encephalopathy: Secondary | ICD-10-CM | POA: Diagnosis not present

## 2022-07-07 LAB — BASIC METABOLIC PANEL
Anion gap: 12 (ref 5–15)
BUN: 15 mg/dL (ref 8–23)
CO2: 23 mmol/L (ref 22–32)
Calcium: 8.4 mg/dL — ABNORMAL LOW (ref 8.9–10.3)
Chloride: 106 mmol/L (ref 98–111)
Creatinine, Ser: 1.23 mg/dL (ref 0.61–1.24)
GFR, Estimated: >60 mL/min (ref >60)
Glucose, Bld: 98 mg/dL (ref 70–99)
Potassium: 3.2 mmol/L — ABNORMAL LOW (ref 3.5–5.1)
Sodium: 141 mmol/L (ref 135–145)

## 2022-07-07 LAB — RESPIRATORY PANEL BY PCR

## 2022-07-07 LAB — CBC WITH DIFFERENTIAL/PLATELET
Abs Immature Granulocytes: 0.04 10*3/uL (ref 0.00–0.07)
Basophils Absolute: 0 10*3/uL (ref 0.0–0.1)
Basophils Relative: 0 %
Eosinophils Absolute: 0 10*3/uL (ref 0.0–0.5)
Eosinophils Relative: 0 %
HCT: 39.5 % (ref 39.0–52.0)
Hemoglobin: 13.5 g/dL (ref 13.0–17.0)
Immature Granulocytes: 1 %
Lymphocytes Relative: 6 %
Lymphs Abs: 0.5 10*3/uL — ABNORMAL LOW (ref 0.7–4.0)
MCH: 31.3 pg (ref 26.0–34.0)
MCHC: 34.2 g/dL (ref 30.0–36.0)
MCV: 91.4 fL (ref 80.0–100.0)
Monocytes Absolute: 0.7 10*3/uL (ref 0.1–1.0)
Monocytes Relative: 8 %
Neutro Abs: 7.1 10*3/uL (ref 1.7–7.7)
Neutrophils Relative %: 85 %
Platelets: 153 10*3/uL (ref 150–400)
RBC: 4.32 MIL/uL (ref 4.22–5.81)
RDW: 13.2 % (ref 11.5–15.5)
WBC: 8.3 10*3/uL (ref 4.0–10.5)
nRBC: 0 % (ref 0.0–0.2)

## 2022-07-07 LAB — VDRL, CSF: VDRL Quant, CSF: NONREACTIVE

## 2022-07-07 LAB — PROCALCITONIN: Procalcitonin: 3.74 ng/mL

## 2022-07-07 MED ORDER — POTASSIUM CHLORIDE CRYS ER 20 MEQ PO TBCR
40.0000 meq | EXTENDED_RELEASE_TABLET | Freq: Once | ORAL | Status: AC
  Administered 2022-07-07: 40 meq via ORAL
  Filled 2022-07-07: qty 2

## 2022-07-07 NOTE — Progress Notes (Addendum)
Physical Therapy Treatment Patient Details Name: Eddie Morris MRN: [id number] DOB: [birth year] Today's Date: 07/07/2022   History of Present Illness Pt is a 71 y/o male who presented with gait abnormality and AMS. CT head negative for abnormalities. PMH: Lewy body dementia, HTN, CKD    PT Comments    Pt more alert and interactive. Able to stand at bedside with 2 person min assist. Pt being taken for a test so unable to attempt ambulation.    Recommendations for follow up therapy are one component of a multi-disciplinary discharge planning process, led by the attending physician.  Recommendations may be updated based on patient status, additional functional criteria and insurance authorization.  Follow Up Recommendations  Skilled nursing-short term rehab (<3 hours/day) Can patient physically be transported by private vehicle: No   Assistance Recommended at Discharge Frequent or constant Supervision/Assistance  Patient can return home with the following Two people to help with walking and/or transfers;Two people to help with bathing/dressing/bathroom;Direct supervision/assist for medications management;Direct supervision/assist for financial management;Assist for transportation;Help with stairs or ramp for entrance   Equipment Recommendations  None recommended by PT    Recommendations for Other Services       Precautions / Restrictions Precautions Precautions: Fall;Other (comment) Precaution Comments: watch HR Restrictions Weight Bearing Restrictions: No     Mobility  Bed Mobility Overal bed mobility: Needs Assistance Bed Mobility: Supine to Sit, Sit to Supine     Supine to sit: HOB elevated, Mod assist Sit to supine: Mod assist   General bed mobility comments: Assist to initiate legs off of bed, elevate trunk into sitting and bring hips to EOB. Assist to lower trunk and bring legs back up into bed    Transfers Overall transfer level: Needs assistance Equipment  used: Rolling walker (2 wheels) Transfers: Sit to/from Stand Sit to Stand: +2 physical assistance, Min assist           General transfer comment: Assist to bring hips up and for balance    Ambulation/Gait             Pre-gait activities: Attempted side stepping up EOB with walker and then HHA but pt with difficulty processing.     Stairs             Wheelchair Mobility    Modified Rankin (Stroke Patients Only)       Balance Overall balance assessment: Needs assistance Sitting-balance support: No upper extremity supported, Feet supported Sitting balance-Leahy Scale: Fair     Standing balance support: Bilateral upper extremity supported, Single extremity supported Standing balance-Leahy Scale: Poor Standing balance comment: walker or hand held and min assist for static standing                            Cognition Arousal/Alertness: Awake/alert Behavior During Therapy: Flat affect Overall Cognitive Status: No family/caregiver present to determine baseline cognitive functioning Area of Impairment: Orientation, Attention, Memory, Following commands, Safety/judgement, Awareness, Problem solving                 Orientation Level: Place, Time, Situation Current Attention Level: Focused Memory: Decreased recall of precautions, Decreased short-term memory Following Commands: Follows one step commands inconsistently, Follows one step commands with increased time Safety/Judgement: Decreased awareness of safety, Decreased awareness of deficits Awareness: Intellectual Problem Solving: Slow processing, Decreased initiation, Difficulty sequencing, Requires verbal cues, Requires tactile cues General Comments: Pt following some basic gross movement commands. Difficulty processing commands to side step  up side of bed        Exercises      General Comments General comments (skin integrity, edema, etc.): Pt with O2 on at rest and SPO2 94%. Removed O2  with SpO2 dropping to 87%. O2 replaced.      Pertinent Vitals/Pain Pain Assessment Pain Assessment: PAINAD Breathing: normal Negative Vocalization: none Facial Expression: smiling or inexpressive Body Language: relaxed Consolability: no need to console PAINAD Score: 0    Home Living                          Prior Function            PT Goals (current goals can now be found in the care plan section) Progress towards PT goals: Progressing toward goals    Frequency    Min 2X/week      PT Plan Current plan remains appropriate    Co-evaluation              AM-PAC PT "6 Clicks" Mobility   Outcome Measure  Help needed turning from your back to your side while in a flat bed without using bedrails?: A Lot Help needed moving from lying on your back to sitting on the side of a flat bed without using bedrails?: A Lot Help needed moving to and from a bed to a chair (including a wheelchair)?: Total Help needed standing up from a chair using your arms (e.g., wheelchair or bedside chair)?: Total Help needed to walk in hospital room?: Total Help needed climbing 3-5 steps with a railing? : Total 6 Click Score: 8    End of Session Equipment Utilized During Treatment: Gait belt;Oxygen Activity Tolerance: Other (comment);Patient tolerated treatment well (limited by transport arrived to take pt for test) Patient left: in bed;Other (comment) (transporter present) Nurse Communication: Mobility status PT Visit Diagnosis: Unsteadiness on feet (R26.81);Difficulty in walking, not elsewhere classified (R26.2);Muscle weakness (generalized) (M62.81)     Time: [id number] PT Time Calculation (min) (ACUTE ONLY): 11 min  Charges:  $Therapeutic Activity: 8-22 mins                     [place] [facility] [place] 07/07/2022, 2:44 PM
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
## 2022-07-07 NOTE — Plan of Care (Signed)

## 2022-07-07 NOTE — Progress Notes (Signed)
Modified Barium Swallow Progress Note  Patient Details  Name: Eddie Morris MRN: [id number] Date of Birth: 11-01-1950  Today's Date: 07/07/2022  Modified Barium Swallow completed.  Full report located under Chart Review in the Imaging Section.  Brief recommendations include the following:  Clinical Impression  Pt demonstrates swallow function within normal limits for age. Pts head is forward leaning putting pharynx in a almost horizontal position. There was one instance of premature spillage of thin bolus to pharynx with flash penetration. At times a small amount of thin barium lingered in the valleculae post swallow. Pt was able to masticate solids with gums without significant effort. He also masticated a pill. Otherwise, pts strength and timing of swallowing was normal. No aspiration. Recommend pt initaite a mechanical soft diet with thin liquids.   Swallow Evaluation Recommendations       SLP Diet Recommendations: Dysphagia 3 (Mech soft) solids;Thin liquid   Liquid Administration via: Cup;Straw   Medication Administration: Crushed with puree   Supervision: Staff to assist with self feeding   Compensations: Slow rate;Small sips/bites       Oral Care Recommendations: Oral care BID      Eddie Baltimore, MA CCC-SLP  [facility] Secure Chat Preferred Office [phone]   Eddie Morris, Eddie Morris 07/07/2022,2:18 PM

## 2022-07-07 NOTE — Progress Notes (Signed)
Speech Language Pathology Treatment: Dysphagia  Patient Details Name: Eddie Morris MRN: [id number] DOB: 1950-10-22 Today's Date: 07/07/2022 Time: 9833-8250 SLP Time Calculation (min) (ACUTE ONLY): 30 min  Assessment / Plan / Recommendation Clinical Impression  PT seen today for skilled SLP for dysphagia management.  He is alert but does not answer questions appropriately consistently nor follow directions.  He does verbalize desire for "water".  SLP provided oral care and therapeutic po trials offered.  Congested cough at baseline noted without pt ability to clear secretions, despite best effort.  Pt surprisingly passed [employer] challenge.  Pt swallow clinically judged to be timely with no variance in voice - congested cough x1 across all po trials noted.     3 ounce Yale passed and he did not demonstrate coughing or indication of dysphagia with soft food, applesauce, nectar, thin nor ice.  Cannot rule out silent aspiration  but he should've failed Yale if this was the case.  Options include po diet of dys3/thin with precautions vs conducting MBS to allow instrumental evaluation.    Hopeful current pneumonia is due to potential seizure.  Options reviewed include po diet and assess tolerance and/or MBS to allow for instrumental swallow evaluation to fully assess swallow.   Left message with RN and MD via secure chat.      HPI HPI: Patient is a 71 y.o. male with PMH: HTN, HLD, moderate dementia, CKD stage IIIa. He presented to the hospital from home on 07/04/2022 with AMS beyond his baseline. IN ED, patient was afebrile, CT negative for acute finding. On 10/17 1553 he had a recorded temp of 103 degress F and as of 10/18 1200 his temp is 100.7. CXR taken 10/18 reported: Progressive lingular and left lower lobe airspace disease with  new left pleural effusion. Findings are concerning for pneumonia. MRI brain pending.  Note possible seizure per note in chart.      SLP Plan  Continue with  current plan of care      Recommendations for follow up therapy are one component of a multi-disciplinary discharge planning process, led by the attending physician.  Recommendations may be updated based on patient status, additional functional criteria and insurance authorization.    Recommendations  Diet recommendations: Dysphagia 3 (mechanical soft);Thin liquid (vs MBS) Liquids provided via: Cup;Straw Medication Administration: Whole meds with puree Supervision: Full supervision/cueing for compensatory strategies Compensations: Slow rate;Small sips/bites Postural Changes and/or Swallow Maneuvers: Seated upright 90 degrees;Upright 30-60 min after meal                Oral Care Recommendations: Oral care QID;Oral care prior to ice chip/H20 Follow Up Recommendations: Follow physician's recommendations for discharge plan and follow up therapies Assistance recommended at discharge: Frequent or constant Supervision/Assistance SLP Visit Diagnosis: Dysphagia, unspecified (R13.10);Dysphagia, oral phase (R13.11) Plan: Continue with current plan of care         [name], MS [place] Office [phone] Pager [phone]   [name]  07/07/2022, 9:47 AM
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
## 2022-07-08 DIAGNOSIS — I10 Essential (primary) hypertension: Secondary | ICD-10-CM | POA: Diagnosis not present

## 2022-07-08 DIAGNOSIS — N1831 Chronic kidney disease, stage 3a: Secondary | ICD-10-CM | POA: Diagnosis not present

## 2022-07-08 DIAGNOSIS — F03918 Unspecified dementia, unspecified severity, with other behavioral disturbance: Secondary | ICD-10-CM | POA: Diagnosis not present

## 2022-07-08 DIAGNOSIS — R509 Fever, unspecified: Secondary | ICD-10-CM

## 2022-07-08 DIAGNOSIS — G9341 Metabolic encephalopathy: Secondary | ICD-10-CM | POA: Diagnosis not present

## 2022-07-08 LAB — CBC WITH DIFFERENTIAL/PLATELET
Abs Immature Granulocytes: 0.06 10*3/uL (ref 0.00–0.07)
Basophils Absolute: 0 10*3/uL (ref 0.0–0.1)
Basophils Relative: 0 %
Eosinophils Absolute: 0 10*3/uL (ref 0.0–0.5)
Eosinophils Relative: 1 %
HCT: 38.8 % — ABNORMAL LOW (ref 39.0–52.0)
Hemoglobin: 13.3 g/dL (ref 13.0–17.0)
Immature Granulocytes: 1 %
Lymphocytes Relative: 9 %
Lymphs Abs: 0.7 10*3/uL (ref 0.7–4.0)
MCH: 31.2 pg (ref 26.0–34.0)
MCHC: 34.3 g/dL (ref 30.0–36.0)
MCV: 91.1 fL (ref 80.0–100.0)
Monocytes Absolute: 1 10*3/uL (ref 0.1–1.0)
Monocytes Relative: 13 %
Neutro Abs: 6.1 10*3/uL (ref 1.7–7.7)
Neutrophils Relative %: 76 %
Platelets: 164 10*3/uL (ref 150–400)
RBC: 4.26 MIL/uL (ref 4.22–5.81)
RDW: 13.4 % (ref 11.5–15.5)
WBC: 8 10*3/uL (ref 4.0–10.5)
nRBC: 0 % (ref 0.0–0.2)

## 2022-07-08 LAB — BASIC METABOLIC PANEL
Anion gap: 12 (ref 5–15)
BUN: 15 mg/dL (ref 8–23)
CO2: 23 mmol/L (ref 22–32)
Calcium: 8.4 mg/dL — ABNORMAL LOW (ref 8.9–10.3)
Chloride: 106 mmol/L (ref 98–111)
Creatinine, Ser: 1.15 mg/dL (ref 0.61–1.24)
GFR, Estimated: >60 mL/min (ref >60)
Glucose, Bld: 102 mg/dL — ABNORMAL HIGH (ref 70–99)
Potassium: 3.4 mmol/L — ABNORMAL LOW (ref 3.5–5.1)
Sodium: 141 mmol/L (ref 135–145)

## 2022-07-08 LAB — CSF CULTURE W GRAM STAIN
Culture: NO GROWTH
Gram Stain: NONE SEEN

## 2022-07-08 LAB — PROCALCITONIN: Procalcitonin: 2.63 ng/mL

## 2022-07-08 MED ORDER — AMOXICILLIN-POT CLAVULANATE 875-125 MG PO TABS
1.0000 | ORAL_TABLET | Freq: Two times a day (BID) | ORAL | Status: AC
  Administered 2022-07-08 – 2022-07-11 (×7): 1 via ORAL
  Filled 2022-07-08 (×7): qty 1

## 2022-07-08 MED ORDER — POTASSIUM CHLORIDE CRYS ER 20 MEQ PO TBCR
40.0000 meq | EXTENDED_RELEASE_TABLET | Freq: Once | ORAL | Status: AC
  Administered 2022-07-08: 40 meq via ORAL
  Filled 2022-07-08: qty 2
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
## 2022-07-08 NOTE — Progress Notes (Signed)
[facility]  PROGRESS NOTE  Eddie Morris [id number] DOB: 1951/03/25 DOA: 07/04/2022 PCP: Seward Carol, MD   Brief HPI:   71 y.o. male with medical history significant of HTN, HLD, stage 3a CKD, and dementia presenting with AMS.  His wife reported that he was more altered than his usual baseline for the last day or two.  He experienced acute onset of GTC seizure activity in triage with sats dropping to the 70s, some periodic small myoclonic jerks. He also had an episode of about ?6-second pause followed by gradual return to HR in the 60s. At baseline, he is able to to dress himself, sometimes needs assistance and mostly independent with his ADLs. He mostly struggles with his reasoning and logic. In the ED, seizure-like episode in triage with apnea, O2 sat 70s. T102 following seizure. Code sepsis activated.  Patient admitted for further management. Speech therapy evaluation obtained, patient placed on dysphagia 3 diet    Subjective   Patient seen and examined, answering questions appropriately.  Unclear if this is his baseline.   Assessment/Plan:   Acute encephalopathy/possible seizure -Patient was seen by neurology; was felt to be likely from hypoxemia -EEG showed no epileptiform discharges, CT head was unremarkable -LP done on 07/05/2022; CSF analysis was negative for infection -Patient not started on antiepileptic medications -MRI brain showed findings suspicious for amyloid angiopathy which can be seen in setting of dementia -Full dose anticoagulation is contraindicated in future based on MRI brain findings as per neurology -Mental status has significantly improved; likely back to baseline -Patient does have underlying dementia  Sepsis -Secondary to aspiration pneumonia -Procalcitonin was elevated 3.17, improved to 2.63 today -Patient was started on broad-spectrum antibiotics, vancomycin, cefepime, Zithromax, Flagyl -Antibiotics were narrowed down to  IV Unasyn  and Zithromax -ID was consulted -Zithromax has been discontinued per ID -IV Unasyn changed to p.o. Augmentin for 4 more days  Acute metabolic encephalopathy -Patient has underlying dementia -Slowly improving -Likely close to baseline  Sinus pause -Noted to have 6-second pause on monitor -EKG showed no ST changes but showed PVCs -Continue monitoring on telemetry -Aricept has been discontinued for now -Consult cardiology if it recurs  Hypokalemia -Potassium is 3.4 today -We will replace potassium and follow BMP in am  Hypertension -Blood pressure is stable -Continue to hold amlodipine  Hyperlipidemia -Continue rosuvastatin  Stage IIIa CKD -Creatinine stable  Dementia -Continue Namenda, Aricept on hold as above -Continue delirium precautions   Medications     amoxicillin-clavulanate  1 tablet Oral Q12H   docusate sodium  100 mg Oral BID   enoxaparin (LOVENOX) injection  40 mg Subcutaneous Q24H   memantine  10 mg Oral BID   rosuvastatin  10 mg Oral Daily   sodium chloride flush  3 mL Intravenous Q12H     Data Reviewed:   CBG:  Recent Labs  Lab 07/04/22 0703  GLUCAP 117*    SpO2: 95 %    Vitals:   07/08/22 0300 07/08/22 0700 07/08/22 1100 07/08/22 1620  BP: 138/84 (!) 177/79 130/69 119/68  Pulse: 85 84 70 78  Resp: 18 20 18 18   Temp: 98.6 F (37 C) 98.1 F (36.7 C) 98.5 F (36.9 C) 99.1 F (37.3 C)  TempSrc: Axillary Axillary Axillary Axillary  SpO2: 96% 95% 95%   Weight:      Height:          Data Reviewed:  Basic Metabolic Panel: Recent Labs  Lab 07/04/22 0440 07/05/22 0403 07/06/22 0232 07/07/22 0238 07/08/22  0235  NA 139 140 140 141 141  K 3.5 4.2 3.3* 3.2* 3.4*  CL 106 107 104 106 106  CO2 24 17* 21* 23 23  GLUCOSE 127* 83 100* 98 102*  BUN 11 11 16 15 15   CREATININE 1.31* 1.21 1.46* 1.23 1.15  CALCIUM 9.3 8.8* 9.0 8.4* 8.4*    CBC: Recent Labs  Lab 07/04/22 0440 07/05/22 0832 07/06/22 0232 07/07/22 0238  07/08/22 0235  WBC 15.1* 12.3* 12.6* 8.3 8.0  NEUTROABS 11.6*  --  10.8* 7.1 6.1  HGB 14.2 10.9* 15.2 13.5 13.3  HCT 41.2 31.7* 44.2 39.5 38.8*  MCV 93.4 93.5 92.5 91.4 91.1  PLT 193 129* 127* 153 164    LFT Recent Labs  Lab 07/04/22 0440  AST 18  ALT 11  ALKPHOS 59  BILITOT 1.4*  PROT 7.3  ALBUMIN 3.9     Antibiotics: Anti-infectives (From admission, onward)    Start     Dose/Rate Route Frequency Ordered Stop   07/08/22 2200  amoxicillin-clavulanate (AUGMENTIN) 875-125 MG per tablet 1 tablet        1 tablet Oral Every 12 hours 07/08/22 1430 07/12/22 0959   07/06/22 2200  ceFEPIme (MAXIPIME) 2 g in sodium chloride 0.9 % 100 mL IVPB  Status:  Discontinued        2 g 200 mL/hr over 30 Minutes Intravenous Every 12 hours 07/06/22 1357 07/06/22 1404   07/06/22 1500  Ampicillin-Sulbactam (UNASYN) 3 g in sodium chloride 0.9 % 100 mL IVPB  Status:  Discontinued        3 g 200 mL/hr over 30 Minutes Intravenous Every 8 hours 07/06/22 1404 07/08/22 1430   07/05/22 1800  ceFEPIme (MAXIPIME) 2 g in sodium chloride 0.9 % 100 mL IVPB  Status:  Discontinued        2 g 200 mL/hr over 30 Minutes Intravenous Every 8 hours 07/05/22 1647 07/06/22 1357   07/05/22 1745  vancomycin (VANCOREADY) IVPB 1250 mg/250 mL  Status:  Discontinued        1,250 mg 166.7 mL/hr over 90 Minutes Intravenous Every 24 hours 07/05/22 1647 07/06/22 1646   07/05/22 1730  metroNIDAZOLE (FLAGYL) IVPB 500 mg  Status:  Discontinued        500 mg 100 mL/hr over 60 Minutes Intravenous Every 12 hours 07/05/22 1632 07/06/22 1404   07/05/22 1430  Ampicillin-Sulbactam (UNASYN) 3 g in sodium chloride 0.9 % 100 mL IVPB  Status:  Discontinued        3 g 200 mL/hr over 30 Minutes Intravenous Every 6 hours 07/05/22 1341 07/05/22 1632   07/05/22 1400  azithromycin (ZITHROMAX) 500 mg in sodium chloride 0.9 % 250 mL IVPB  Status:  Discontinued        500 mg 250 mL/hr over 60 Minutes Intravenous Every 24 hours 07/05/22 1300  07/07/22 0947   07/04/22 0515  ceFEPIme (MAXIPIME) 2 g in sodium chloride 0.9 % 100 mL IVPB        2 g 200 mL/hr over 30 Minutes Intravenous  Once 07/04/22 0500 07/04/22 0603   07/04/22 0515  metroNIDAZOLE (FLAGYL) IVPB 500 mg        500 mg 100 mL/hr over 60 Minutes Intravenous  Once 07/04/22 0500 07/04/22 0710   07/04/22 0515  vancomycin (VANCOCIN) IVPB 1000 mg/200 mL premix  Status:  Discontinued        1,000 mg 200 mL/hr over 60 Minutes Intravenous  Once 07/04/22 0500 07/04/22 0507   07/04/22 0515  vancomycin (VANCOREADY) IVPB 1500 mg/300 mL        1,500 mg 150 mL/hr over 120 Minutes Intravenous  Once 07/04/22 0507 07/04/22 1125        DVT prophylaxis: Lovenox  Code Status: DNR  Family Communication: No family at bedside   CONSULTS infectious disease   Objective    Physical Examination:   General: Appears in no acute distress Cardiovascular: S1-S2, regular, no murmur auscultated Respiratory: Bilateral rhonchi auscultated Abdomen: Abdomen is soft, nontender, no organomegaly Extremities: No edema in the lower extremities Neurologic: Alert, oriented to self and place only   Status is: Inpatient:             Oswald Hillock   [facility] If 7PM-7AM, please contact night-coverage at [email or web address], Office  [phone]   07/08/2022, 6:38 PM  LOS: 4 days
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
## 2022-07-08 NOTE — NC FL2 (Signed)
[place] MEDICAID FL2 LEVEL OF CARE SCREENING TOOL     IDENTIFICATION  Patient Name: Eddie Morris Birthdate: Feb 12, 1951 Sex: male Admission Date (Current Location): 07/04/2022  [facility] and [place] Number:  Herbalist and Address:  [facility] [place], [place] [address] [place], [place] [address]      Provider Number: [id number]  Attending Physician Name and Address:  Oswald Hillock, MD  Relative Name and Phone Number:  Derenda Fennel, [phone]    Current Level of Care: Hospital Recommended Level of Care: [place] Prior Approval Number:    Date Approved/Denied:   PASRR Number: [id number]  Discharge Plan: SNF    Current Diagnoses: Patient Active Problem List   Diagnosis Date Noted   Hypokalemia 0000000   Acute metabolic encephalopathy 0000000   Seizure-like activity ([place]) 07/04/2022   Sinus pause 07/04/2022   Chronic kidney disease, stage 3a ([place]) 05/08/2022   Dementia with behavioral disturbance ([place]) 05/08/2022   Essential hypertension 05/08/2022   Pure hypercholesterolemia 05/08/2022   Tobacco user 05/08/2022   Gait instability 05/08/2022    Orientation RESPIRATION BLADDER Height & Weight     Self  Normal Incontinent, External catheter Weight: 175 lb (79.4 kg) Height:  6' (182.9 cm)  BEHAVIORAL SYMPTOMS/MOOD NEUROLOGICAL BOWEL NUTRITION STATUS      Continent Diet (See discharge summary.)  AMBULATORY STATUS COMMUNICATION OF NEEDS Skin   Limited Assist Verbally Normal                       Personal Care Assistance Level of Assistance  Bathing, Feeding, Dressing Bathing Assistance: Limited assistance Feeding assistance: Limited assistance Dressing Assistance: Limited assistance     Functional Limitations Info  Sight, Hearing Sight Info: Impaired Hearing Info: Adequate Speech Info: Adequate    SPECIAL CARE FACTORS FREQUENCY  PT (By licensed PT), OT (By licensed OT)     PT Frequency:  5x/week OT Frequency: 5x/week            Contractures Contractures Info: Not present    Additional Factors Info  Code Status, Allergies Code Status Info: Full code Allergies Info: Latex           Current Medications (07/08/2022):  This is the current hospital active medication list Current Facility-Administered Medications  Medication Dose Route Frequency Provider Last Rate Last Admin   acetaminophen (TYLENOL) tablet 650 mg  650 mg Oral Q6H PRN Karmen Bongo, MD       Or   acetaminophen (TYLENOL) suppository 650 mg  650 mg Rectal Q6H PRN Karmen Bongo, MD   650 mg at 07/07/22 0318   amoxicillin-clavulanate (AUGMENTIN) 875-125 MG per tablet 1 tablet  1 tablet Oral Q12H Carlyle Basques, MD       bisacodyl (DULCOLAX) EC tablet 5 mg  5 mg Oral Daily PRN Karmen Bongo, MD       docusate sodium (COLACE) capsule 100 mg  100 mg Oral BID Karmen Bongo, MD   100 mg at 07/08/22 1003   enoxaparin (LOVENOX) injection 40 mg  40 mg Subcutaneous Q24H Karmen Bongo, MD   40 mg at 07/08/22 1002   hydrALAZINE (APRESOLINE) injection 5 mg  5 mg Intravenous Q4H PRN Karmen Bongo, MD       lactated ringers infusion   Intravenous Continuous Karmen Bongo, MD 75 mL/hr at 07/05/22 0153 Rate Verify at 07/05/22 0153   memantine (NAMENDA) tablet 10 mg  10 mg Oral BID Karmen Bongo, MD   10 mg  at 07/08/22 1003   morphine (PF) 2 MG/ML injection 2 mg  2 mg Intravenous Q2H PRN Karmen Bongo, MD   2 mg at 07/04/22 1236   ondansetron (ZOFRAN) tablet 4 mg  4 mg Oral Q6H PRN Karmen Bongo, MD       Or   ondansetron [facility]) injection 4 mg  4 mg Intravenous Q6H PRN Karmen Bongo, MD       polyethylene glycol (MIRALAX / GLYCOLAX) packet 17 g  17 g Oral Daily PRN Karmen Bongo, MD       rosuvastatin (CRESTOR) tablet 10 mg  10 mg Oral Daily Karmen Bongo, MD   10 mg at 07/08/22 1003   sodium chloride flush (NS) 0.9 % injection 3 mL  3 mL Intravenous Q12H Karmen Bongo, MD   3 mL at 07/08/22  1003     Discharge Medications: Please see discharge summary for a list of discharge medications.  Relevant Imaging Results:  Relevant Lab Results:   Additional Information SSN: [id number]  Barton Fanny, Student-Social Work

## 2022-07-08 NOTE — Plan of Care (Signed)

## 2022-07-09 DIAGNOSIS — G9341 Metabolic encephalopathy: Secondary | ICD-10-CM | POA: Diagnosis not present

## 2022-07-09 DIAGNOSIS — F03918 Unspecified dementia, unspecified severity, with other behavioral disturbance: Secondary | ICD-10-CM | POA: Diagnosis not present

## 2022-07-09 DIAGNOSIS — I10 Essential (primary) hypertension: Secondary | ICD-10-CM | POA: Diagnosis not present

## 2022-07-09 DIAGNOSIS — N1831 Chronic kidney disease, stage 3a: Secondary | ICD-10-CM | POA: Diagnosis not present

## 2022-07-09 LAB — CBC WITH DIFFERENTIAL/PLATELET
Abs Immature Granulocytes: 0.07 10*3/uL (ref 0.00–0.07)
Basophils Absolute: 0 10*3/uL (ref 0.0–0.1)
Basophils Relative: 0 %
Eosinophils Absolute: 0.1 10*3/uL (ref 0.0–0.5)
Eosinophils Relative: 1 %
HCT: 41.6 % (ref 39.0–52.0)
Hemoglobin: 14.2 g/dL (ref 13.0–17.0)
Immature Granulocytes: 1 %
Lymphocytes Relative: 12 %
Lymphs Abs: 1 10*3/uL (ref 0.7–4.0)
MCH: 30.9 pg (ref 26.0–34.0)
MCHC: 34.1 g/dL (ref 30.0–36.0)
MCV: 90.4 fL (ref 80.0–100.0)
Monocytes Absolute: 1.2 10*3/uL — ABNORMAL HIGH (ref 0.1–1.0)
Monocytes Relative: 15 %
Neutro Abs: 5.5 10*3/uL (ref 1.7–7.7)
Neutrophils Relative %: 71 %
Platelets: 183 10*3/uL (ref 150–400)
RBC: 4.6 MIL/uL (ref 4.22–5.81)
RDW: 13.3 % (ref 11.5–15.5)
WBC: 7.8 10*3/uL (ref 4.0–10.5)
nRBC: 0 % (ref 0.0–0.2)

## 2022-07-09 LAB — BASIC METABOLIC PANEL
Anion gap: 10 (ref 5–15)
BUN: 12 mg/dL (ref 8–23)
CO2: 23 mmol/L (ref 22–32)
Calcium: 8.3 mg/dL — ABNORMAL LOW (ref 8.9–10.3)
Chloride: 106 mmol/L (ref 98–111)
Creatinine, Ser: 1.08 mg/dL (ref 0.61–1.24)
GFR, Estimated: >60 mL/min (ref >60)
Glucose, Bld: 96 mg/dL (ref 70–99)
Potassium: 3.2 mmol/L — ABNORMAL LOW (ref 3.5–5.1)
Sodium: 139 mmol/L (ref 135–145)

## 2022-07-09 LAB — CULTURE, BLOOD (ROUTINE X 2)
Culture: NO GROWTH
Special Requests: ADEQUATE

## 2022-07-09 MED ORDER — POTASSIUM CHLORIDE CRYS ER 20 MEQ PO TBCR
40.0000 meq | EXTENDED_RELEASE_TABLET | ORAL | Status: AC
  Administered 2022-07-09 (×2): 40 meq via ORAL
  Filled 2022-07-09 (×2): qty 2

## 2022-07-09 NOTE — Progress Notes (Signed)
[facility]  PROGRESS NOTE  Eddie Morris [id number] DOB: 1951/06/20 DOA: 07/04/2022 PCP: Seward Carol, MD   Brief HPI:   71 y.o. male with medical history significant of HTN, HLD, stage 3a CKD, and dementia presenting with AMS.  His wife reported that he was more altered than his usual baseline for the last day or two.  He experienced acute onset of GTC seizure activity in triage with sats dropping to the 70s, some periodic small myoclonic jerks. He also had an episode of about ?6-second pause followed by gradual return to HR in the 60s. At baseline, he is able to to dress himself, sometimes needs assistance and mostly independent with his ADLs. He mostly struggles with his reasoning and logic. In the ED, seizure-like episode in triage with apnea, O2 sat 70s. T102 following seizure. Code sepsis activated.  Patient admitted for further management. Speech therapy evaluation obtained, patient placed on dysphagia 3 diet    Subjective   Patient seen and examined, more alert today.  Wife at bedside.  As per wife patient's mental status is not back to baseline.   Assessment/Plan:   Acute encephalopathy/possible seizure -Patient was seen by neurology; was felt to be likely from hypoxemia -EEG showed no epileptiform discharges, CT head was unremarkable -LP done on 07/05/2022; CSF analysis was negative for infection -Patient not started on antiepileptic medications -MRI brain showed findings suspicious for amyloid angiopathy which can be seen in setting of dementia -Full dose anticoagulation is contraindicated in future based on MRI brain findings as per neurology -Mental status has significantly improved; not back to baseline as per wife on -Patient does have underlying dementia  Sepsis -Secondary to aspiration pneumonia -Procalcitonin was elevated 3.17, improved to 2.63 today -Patient was started on broad-spectrum antibiotics, vancomycin, cefepime, Zithromax,  Flagyl -Antibiotics were narrowed down to  IV Unasyn and Zithromax -ID was consulted -Zithromax has been discontinued per ID -IV Unasyn changed to p.o. Augmentin for 3 more days -We will obtain chest PT to help with retained secretions every 4 hours while awake x4  Acute metabolic encephalopathy -Patient has underlying dementia -Slowly improving -Likely close to baseline  Sinus pause -Noted to have 6-second pause on monitor -EKG showed no ST changes but showed PVCs -Continue monitoring on telemetry -Aricept has been discontinued for now -Consult cardiology if it recurs  Hypokalemia -Potassium is 3.2 today -Replace potassium and follow BMP in am  Hypertension -Blood pressure is stable -Continue to hold amlodipine  Hyperlipidemia -Continue rosuvastatin  Stage IIIa CKD -Creatinine stable  Dementia -Continue Namenda, Aricept on hold as above -Continue delirium precautions   Medications     amoxicillin-clavulanate  1 tablet Oral Q12H   docusate sodium  100 mg Oral BID   enoxaparin (LOVENOX) injection  40 mg Subcutaneous Q24H   memantine  10 mg Oral BID   potassium chloride  40 mEq Oral Q4H   rosuvastatin  10 mg Oral Daily   sodium chloride flush  3 mL Intravenous Q12H     Data Reviewed:   CBG:  Recent Labs  Lab 07/04/22 0703  GLUCAP 117*    SpO2: 99 % O2 Flow Rate (L/min): 2 L/min    Vitals:   07/09/22 0000 07/09/22 0400 07/09/22 0801 07/09/22 1300  BP: 134/76 (!) 113/97 (!) 160/66 (!) 162/81  Pulse: 88 80 63 65  Resp: 20 18 18 20   Temp: 98.9 F (37.2 C) 98.3 F (36.8 C) 98.8 F (37.1 C) 98 F (36.7 C)  TempSrc: Oral Oral  Oral Oral  SpO2: 94% 94% 96% 99%  Weight:      Height:          Data Reviewed:  Basic Metabolic Panel: Recent Labs  Lab 07/05/22 0403 07/06/22 0232 07/07/22 0238 07/08/22 0235 07/09/22 0418  NA 140 140 141 141 139  K 4.2 3.3* 3.2* 3.4* 3.2*  CL 107 104 106 106 106  CO2 17* 21* 23 23 23   GLUCOSE 83 100* 98  102* 96  BUN 11 16 15 15 12   CREATININE 1.21 1.46* 1.23 1.15 1.08  CALCIUM 8.8* 9.0 8.4* 8.4* 8.3*    CBC: Recent Labs  Lab 07/04/22 0440 07/05/22 0832 07/06/22 0232 07/07/22 0238 07/08/22 0235 07/09/22 0418  WBC 15.1* 12.3* 12.6* 8.3 8.0 7.8  NEUTROABS 11.6*  --  10.8* 7.1 6.1 5.5  HGB 14.2 10.9* 15.2 13.5 13.3 14.2  HCT 41.2 31.7* 44.2 39.5 38.8* 41.6  MCV 93.4 93.5 92.5 91.4 91.1 90.4  PLT 193 129* 127* 153 164 183    LFT Recent Labs  Lab 07/04/22 0440  AST 18  ALT 11  ALKPHOS 59  BILITOT 1.4*  PROT 7.3  ALBUMIN 3.9     Antibiotics: Anti-infectives (From admission, onward)    Start     Dose/Rate Route Frequency Ordered Stop   07/08/22 2200  amoxicillin-clavulanate (AUGMENTIN) 875-125 MG per tablet 1 tablet        1 tablet Oral Every 12 hours 07/08/22 1430 07/12/22 0959   07/06/22 2200  ceFEPIme (MAXIPIME) 2 g in sodium chloride 0.9 % 100 mL IVPB  Status:  Discontinued        2 g 200 mL/hr over 30 Minutes Intravenous Every 12 hours 07/06/22 1357 07/06/22 1404   07/06/22 1500  Ampicillin-Sulbactam (UNASYN) 3 g in sodium chloride 0.9 % 100 mL IVPB  Status:  Discontinued        3 g 200 mL/hr over 30 Minutes Intravenous Every 8 hours 07/06/22 1404 07/08/22 1430   07/05/22 1800  ceFEPIme (MAXIPIME) 2 g in sodium chloride 0.9 % 100 mL IVPB  Status:  Discontinued        2 g 200 mL/hr over 30 Minutes Intravenous Every 8 hours 07/05/22 1647 07/06/22 1357   07/05/22 1745  vancomycin (VANCOREADY) IVPB 1250 mg/250 mL  Status:  Discontinued        1,250 mg 166.7 mL/hr over 90 Minutes Intravenous Every 24 hours 07/05/22 1647 07/06/22 1646   07/05/22 1730  metroNIDAZOLE (FLAGYL) IVPB 500 mg  Status:  Discontinued        500 mg 100 mL/hr over 60 Minutes Intravenous Every 12 hours 07/05/22 1632 07/06/22 1404   07/05/22 1430  Ampicillin-Sulbactam (UNASYN) 3 g in sodium chloride 0.9 % 100 mL IVPB  Status:  Discontinued        3 g 200 mL/hr over 30 Minutes Intravenous Every 6  hours 07/05/22 1341 07/05/22 1632   07/05/22 1400  azithromycin (ZITHROMAX) 500 mg in sodium chloride 0.9 % 250 mL IVPB  Status:  Discontinued        500 mg 250 mL/hr over 60 Minutes Intravenous Every 24 hours 07/05/22 1300 07/07/22 0947   07/04/22 0515  ceFEPIme (MAXIPIME) 2 g in sodium chloride 0.9 % 100 mL IVPB        2 g 200 mL/hr over 30 Minutes Intravenous  Once 07/04/22 0500 07/04/22 0603   07/04/22 0515  metroNIDAZOLE (FLAGYL) IVPB 500 mg        500 mg 100 mL/hr  over 60 Minutes Intravenous  Once 07/04/22 0500 07/04/22 0710   07/04/22 0515  vancomycin (VANCOCIN) IVPB 1000 mg/200 mL premix  Status:  Discontinued        1,000 mg 200 mL/hr over 60 Minutes Intravenous  Once 07/04/22 0500 07/04/22 0507   07/04/22 0515  vancomycin (VANCOREADY) IVPB 1500 mg/300 mL        1,500 mg 150 mL/hr over 120 Minutes Intravenous  Once 07/04/22 0507 07/04/22 1125        DVT prophylaxis: Lovenox  Code Status: DNR  Family Communication: No family at bedside   CONSULTS infectious disease   Objective    Physical Examination:   General: Appears in no acute distress Cardiovascular: S1-S2, regular, no murmur auscultated Respiratory: Bilateral rhonchi auscultated Abdomen: Abdomen is soft, nontender, no organomegaly Extremities: No edema in the lower extremities Neurologic: Alert, oriented to place, person only, following commands   Status is: Inpatient:             Oswald Hillock   [facility] If 7PM-7AM, please contact night-coverage at [email or web address], Office  [phone]   07/09/2022, 3:29 PM  LOS: 5 days

## 2022-07-09 NOTE — Plan of Care (Signed)

## 2022-07-09 NOTE — Progress Notes (Signed)
Physical Therapy Treatment Patient Details Name: Eddie Morris MRN: [id number] DOB: 1950-12-15 Today's Date: 07/09/2022   History of Present Illness Pt is a 71 y/o male who presented with gait abnormality and AMS. CT head negative for abnormalities. PMH: Lewy body dementia, HTN, CKD    PT Comments    Pt alert today but continues to not follow commands and be resistive to mobility at times. Needed mod A +2 to come to EOB and stand bedside. Pt would not step feet with vc's or manual facilitation to advance ambulation. Pt maintained posterior lean while standing. Currently at SNF level so if family is wanting to take pt home needs to be able to have 2 person assist for mobility and would still not be easy to care for him. PT will continue to follow.    Recommendations for follow up therapy are one component of a multi-disciplinary discharge planning process, led by the attending physician.  Recommendations may be updated based on patient status, additional functional criteria and insurance authorization.  Follow Up Recommendations  Skilled nursing-short term rehab (<3 hours/day) Can patient physically be transported by private vehicle: No   Assistance Recommended at Discharge Frequent or constant Supervision/Assistance  Patient can return home with the following Two people to help with walking and/or transfers;Two people to help with bathing/dressing/bathroom;Direct supervision/assist for medications management;Direct supervision/assist for financial management;Assist for transportation;Help with stairs or ramp for entrance   Equipment Recommendations  None recommended by PT    Recommendations for Other Services OT consult     Precautions / Restrictions Precautions Precautions: Fall;Other (comment) Precaution Comments: watch HR Restrictions Weight Bearing Restrictions: No     Mobility  Bed Mobility Overal bed mobility: Needs Assistance Bed Mobility: Supine to Sit, Sit to  Supine     Supine to sit: HOB elevated, Mod assist, +2 for physical assistance Sit to supine: +2 for physical assistance, Total assist   General bed mobility comments: pt needed mod A for initiation as well as LE's off bed and elevation of trunk. Tot A +2 to return to supine.    Transfers Overall transfer level: Needs assistance Equipment used: Rolling walker (2 wheels) Transfers: Sit to/from Stand Sit to Stand: +2 physical assistance, Mod assist           General transfer comment: pt resistant to standing. Facilitation given because pt sitting in wet bed and was trying to help him get changed and dry. Mod A +2 to stand but then pt maintained standing with min A +2. Posterior lean noted throughout    Ambulation/Gait Ambulation/Gait assistance: Mod assist, +2 physical assistance   Assistive device: Rolling walker (2 wheels) Gait Pattern/deviations: Decreased stride length, Drifts right/left, Step-to pattern Gait velocity: decr Gait velocity interpretation: <1.31 ft/sec, indicative of household ambulator   General Gait Details: attempted side stepping but pt not following commands. Took about 4 steps with manual facilitation of wt shifting and stepping but pt would not continue   Chief Strategy Officer    Modified Rankin (Stroke Patients Only)       Balance Overall balance assessment: Needs assistance Sitting-balance support: No upper extremity supported, Feet supported Sitting balance-Leahy Scale: Poor Sitting balance - Comments: close guarding in sitting Postural control: Posterior lean Standing balance support: Bilateral upper extremity supported, Single extremity supported Standing balance-Leahy Scale: Poor Standing balance comment: mod A +2 to maintain initial standing. Once pt started putting some wt through RW he was  able to maintain standing with min A of 1                            Cognition Arousal/Alertness:  Awake/alert Behavior During Therapy: Flat affect Overall Cognitive Status: No family/caregiver present to determine baseline cognitive functioning Area of Impairment: Orientation, Attention, Memory, Following commands, Safety/judgement, Awareness, Problem solving                 Orientation Level: Place, Time, Situation Current Attention Level: Focused Memory: Decreased recall of precautions, Decreased short-term memory Following Commands: Follows one step commands inconsistently, Follows one step commands with increased time Safety/Judgement: Decreased awareness of safety, Decreased awareness of deficits Awareness: Intellectual Problem Solving: Slow processing, Decreased initiation, Difficulty sequencing, Requires verbal cues, Requires tactile cues General Comments: pt with minimal verbalization and needing tactile cues to follow commands, not following verbal commands. Pt sometimes resistant to mobility        Exercises      General Comments General comments (skin integrity, edema, etc.): VSS. No family present during session.      Pertinent Vitals/Pain Pain Assessment Pain Assessment: Faces Pain Score: 0-No pain    Home Living                          Prior Function            PT Goals (current goals can now be found in the care plan section) Acute Rehab PT Goals Patient Stated Goal: none stated PT Goal Formulation: Patient unable to participate in goal setting Time For Goal Achievement: 07/19/22 Potential to Achieve Goals: Fair Progress towards PT goals: Progressing toward goals    Frequency    Min 2X/week      PT Plan Current plan remains appropriate    Co-evaluation              AM-PAC PT "6 Clicks" Mobility   Outcome Measure  Help needed turning from your back to your side while in a flat bed without using bedrails?: A Lot Help needed moving from lying on your back to sitting on the side of a flat bed without using bedrails?: A  Lot Help needed moving to and from a bed to a chair (including a wheelchair)?: Total Help needed standing up from a chair using your arms (e.g., wheelchair or bedside chair)?: Total Help needed to walk in hospital room?: Total Help needed climbing 3-5 steps with a railing? : Total 6 Click Score: 8    End of Session Equipment Utilized During Treatment: Gait belt Activity Tolerance: Other (comment) (lmited secondary to mental status) Patient left: in bed;with bed alarm set;with restraints reapplied;with call bell/phone within reach Nurse Communication: Mobility status PT Visit Diagnosis: Unsteadiness on feet (R26.81);Difficulty in walking, not elsewhere classified (R26.2);Muscle weakness (generalized) (M62.81)     Time: [id number] PT Time Calculation (min) (ACUTE ONLY): 16 min  Charges:  $Therapeutic Activity: 8-22 mins                     Leighton Roach, PT  Acute Rehab Services Secure chat preferred Office [place] 07/09/2022, 2:22 PM

## 2022-07-10 ENCOUNTER — Inpatient Hospital Stay (HOSPITAL_COMMUNITY): Payer: Medicare PPO

## 2022-07-10 DIAGNOSIS — G9341 Metabolic encephalopathy: Secondary | ICD-10-CM | POA: Diagnosis not present

## 2022-07-10 LAB — CBC WITH DIFFERENTIAL/PLATELET
Abs Immature Granulocytes: 0.12 10*3/uL — ABNORMAL HIGH (ref 0.00–0.07)
Basophils Absolute: 0 10*3/uL (ref 0.0–0.1)
Basophils Relative: 0 %
Eosinophils Absolute: 0.2 10*3/uL (ref 0.0–0.5)
Eosinophils Relative: 2 %
HCT: 38.3 % — ABNORMAL LOW (ref 39.0–52.0)
Hemoglobin: 13 g/dL (ref 13.0–17.0)
Immature Granulocytes: 2 %
Lymphocytes Relative: 13 %
Lymphs Abs: 1 10*3/uL (ref 0.7–4.0)
MCH: 30.9 pg (ref 26.0–34.0)
MCHC: 33.9 g/dL (ref 30.0–36.0)
MCV: 91 fL (ref 80.0–100.0)
Monocytes Absolute: 1 10*3/uL (ref 0.1–1.0)
Monocytes Relative: 13 %
Neutro Abs: 5.3 10*3/uL (ref 1.7–7.7)
Neutrophils Relative %: 70 %
Platelets: 221 10*3/uL (ref 150–400)
RBC: 4.21 MIL/uL — ABNORMAL LOW (ref 4.22–5.81)
RDW: 13.1 % (ref 11.5–15.5)
WBC: 7.5 10*3/uL (ref 4.0–10.5)
nRBC: 0 % (ref 0.0–0.2)

## 2022-07-10 LAB — BASIC METABOLIC PANEL
Anion gap: 10 (ref 5–15)
BUN: 12 mg/dL (ref 8–23)
CO2: 22 mmol/L (ref 22–32)
Calcium: 8.3 mg/dL — ABNORMAL LOW (ref 8.9–10.3)
Chloride: 107 mmol/L (ref 98–111)
Creatinine, Ser: 1.05 mg/dL (ref 0.61–1.24)
GFR, Estimated: >60 mL/min (ref >60)
Glucose, Bld: 104 mg/dL — ABNORMAL HIGH (ref 70–99)
Potassium: 3.4 mmol/L — ABNORMAL LOW (ref 3.5–5.1)
Sodium: 139 mmol/L (ref 135–145)

## 2022-07-10 LAB — MAGNESIUM: Magnesium: 2.3 mg/dL (ref 1.7–2.4)

## 2022-07-10 MED ORDER — POTASSIUM CHLORIDE 20 MEQ PO PACK
40.0000 meq | PACK | Freq: Once | ORAL | Status: AC
  Administered 2022-07-10: 40 meq via ORAL
  Filled 2022-07-10: qty 2
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
## 2022-07-11 DIAGNOSIS — E440 Moderate protein-calorie malnutrition: Secondary | ICD-10-CM | POA: Insufficient documentation

## 2022-07-11 DIAGNOSIS — G9341 Metabolic encephalopathy: Secondary | ICD-10-CM | POA: Diagnosis not present

## 2022-07-11 LAB — CBC WITH DIFFERENTIAL/PLATELET
Abs Immature Granulocytes: 0.15 10*3/uL — ABNORMAL HIGH (ref 0.00–0.07)
Basophils Absolute: 0 10*3/uL (ref 0.0–0.1)
Basophils Relative: 0 %
Eosinophils Absolute: 0.3 10*3/uL (ref 0.0–0.5)
Eosinophils Relative: 3 %
HCT: 37.8 % — ABNORMAL LOW (ref 39.0–52.0)
Hemoglobin: 13.4 g/dL (ref 13.0–17.0)
Immature Granulocytes: 2 %
Lymphocytes Relative: 14 %
Lymphs Abs: 1.3 10*3/uL (ref 0.7–4.0)
MCH: 31.8 pg (ref 26.0–34.0)
MCHC: 35.4 g/dL (ref 30.0–36.0)
MCV: 89.8 fL (ref 80.0–100.0)
Monocytes Absolute: 1 10*3/uL (ref 0.1–1.0)
Monocytes Relative: 11 %
Neutro Abs: 6.5 10*3/uL (ref 1.7–7.7)
Neutrophils Relative %: 70 %
Platelets: 292 10*3/uL (ref 150–400)
RBC: 4.21 MIL/uL — ABNORMAL LOW (ref 4.22–5.81)
RDW: 13.2 % (ref 11.5–15.5)
WBC: 9.2 10*3/uL (ref 4.0–10.5)
nRBC: 0 % (ref 0.0–0.2)

## 2022-07-11 LAB — BASIC METABOLIC PANEL
Anion gap: 10 (ref 5–15)
BUN: 9 mg/dL (ref 8–23)
CO2: 22 mmol/L (ref 22–32)
Calcium: 8.2 mg/dL — ABNORMAL LOW (ref 8.9–10.3)
Chloride: 108 mmol/L (ref 98–111)
Creatinine, Ser: 1.07 mg/dL (ref 0.61–1.24)
GFR, Estimated: >60 mL/min (ref >60)
Glucose, Bld: 106 mg/dL — ABNORMAL HIGH (ref 70–99)
Potassium: 3.4 mmol/L — ABNORMAL LOW (ref 3.5–5.1)
Sodium: 140 mmol/L (ref 135–145)

## 2022-07-11 LAB — C-REACTIVE PROTEIN: CRP: 5 mg/dL — ABNORMAL HIGH (ref <1.0)

## 2022-07-11 LAB — BRAIN NATRIURETIC PEPTIDE: B Natriuretic Peptide: 77.8 pg/mL (ref 0.0–100.0)

## 2022-07-11 LAB — MAGNESIUM: Magnesium: 2.2 mg/dL (ref 1.7–2.4)

## 2022-07-11 MED ORDER — BOOST / RESOURCE BREEZE PO LIQD CUSTOM
1.0000 | Freq: Two times a day (BID) | ORAL | Status: DC
  Administered 2022-07-12: 1 via ORAL

## 2022-07-11 MED ORDER — ADULT MULTIVITAMIN W/MINERALS CH
1.0000 | ORAL_TABLET | Freq: Every day | ORAL | Status: DC
  Administered 2022-07-12: 1 via ORAL
  Filled 2022-07-11: qty 1

## 2022-07-11 MED ORDER — POTASSIUM CHLORIDE 20 MEQ PO PACK
40.0000 meq | PACK | Freq: Once | ORAL | Status: AC
  Administered 2022-07-11: 40 meq via ORAL
  Filled 2022-07-11: qty 2

## 2022-07-11 NOTE — Progress Notes (Signed)
Breakfast ordered for patient per family's request. Pt completed oral potassium, tolerated well, resting in bed, HOB elevated, condom cath remains in place, bed alarm activated and audible, will continue to monitor

## 2022-07-11 NOTE — Progress Notes (Signed)
Nutrition Follow-up  DOCUMENTATION CODES:   Non-severe (moderate) malnutrition in context of chronic illness  INTERVENTION:   Multivitamin w/ minerals daily Boost Breeze po BID, each supplement provides 250 kcal and 9 grams of protein Feeding assist with all meals  NUTRITION DIAGNOSIS:   Moderate Malnutrition related to chronic illness as evidenced by moderate fat depletion, severe muscle depletion.  GOAL:   Patient will meet greater than or equal to 90% of their needs - Progressing  MONITOR:   PO intake, Labs, Weight trends, I & O's  REASON FOR ASSESSMENT:   Consult Other (Comment) (Nutritional Goals)  ASSESSMENT:   71 y.o. male presented to the ED with AMS. PMH includes HTN, CKD IIIa, and dementia. Pt admitted with acute metabolic encephalopathy, possible seizures, and sepsis.   Discussed with RN, reports that pt was much more awake yesterday and that he ate fairly well.  Met with wife at bedside, reports that pt seems to be a bit more tired today than yesterday. States that he normally has a great appetite at home. Thinks that he has not been eating as well here due to getting food that he does not really like. Wife provided lunch and dinner order, explained the number to call moving forward to assist in getting food that he likes. Wife reports that pt does not like Ensure or Boost, she has tried to have him drink in the past. Discussed trying out the [facility] since similar to a juice.   Meal Intake: 10/19-10/21: 25-100% x 4 meals (average 69%)  Medications reviewed and include: Augmentin, Colace Labs reviewed: Potassium 3.4  NUTRITION - FOCUSED PHYSICAL EXAM:  Flowsheet Row Most Recent Value  Orbital Region Moderate depletion  Upper Arm Region Moderate depletion  Thoracic and Lumbar Region Moderate depletion  Buccal Region Moderate depletion  Temple Region Moderate depletion  Clavicle Bone Region Severe depletion  Clavicle and Acromion Bone Region Severe  depletion  Scapular Bone Region Severe depletion  Dorsal Hand Moderate depletion  Patellar Region Mild depletion  Anterior Thigh Region Mild depletion  Posterior Calf Region Mild depletion  Edema (RD Assessment) None  Hair Reviewed  Eyes Reviewed  Mouth Unable to assess  Skin Reviewed  Nails Reviewed   Diet Order:   Diet Order             DIET DYS 3 Room service appropriate? Yes; Fluid consistency: Thin  Diet effective now                   EDUCATION NEEDS:   Not appropriate for education at this time  Skin:  Skin Assessment: Reviewed RN Assessment  Last BM:  10/23  Height:   Ht Readings from Last 1 Encounters:  07/04/22 6' (1.829 m)    Weight:   Wt Readings from Last 1 Encounters:  07/04/22 79.4 kg    Ideal Body Weight:  80.9 kg  BMI:  Body mass index is 23.73 kg/m.  Estimated Nutritional Needs:   Kcal:  2000-2200  Protein:  100-115 grams  Fluid:  >/= 2 L    Hermina Barters RD, LDN Clinical Dietitian See [facility] for contact information.
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
## 2022-07-11 NOTE — TOC Progression Note (Signed)
Transition of Care [facility]) - Progression Note    Patient Details  Name: Eddie Morris MRN: [id number] Date of Birth: [birth year]  [facility]) CM/SW Gilliam, LCSW Phone Number: 07/11/2022, 2:16 PM  Clinical Narrative:    CSW spoke with patient's spouse and let her know patient may be medically ready for discharge tomorrow per MD. CSW went over updated therapy notes with her. She is in agreement for SNF rehab with a preference for returning to [facility]. Per [facility] [place], they can accept patient. CSW initiated insurance authorization, Ref# [id number].  CSW then received call back from [facility] [place], stating that patient is a fall and wander risk. CSW spoke with Admissions Director Kia, and explained patient is not currently walking and that patient's spouse would like him to return home as soon as he is able to walk. Kia stated CSW can proceed with authorization process.      Barriers to Discharge: Ship broker  Expected Discharge Plan and Services   In-house Referral: Clinical Social Work     Living arrangements for the past 2 months: Single Family Home                                       Social Determinants of Health (SDOH) Interventions    Readmission Risk Interventions     No data to display

## 2022-07-12 DIAGNOSIS — Z79899 Other long term (current) drug therapy: Secondary | ICD-10-CM | POA: Diagnosis not present

## 2022-07-12 DIAGNOSIS — G4089 Other seizures: Secondary | ICD-10-CM | POA: Diagnosis not present

## 2022-07-12 DIAGNOSIS — E785 Hyperlipidemia, unspecified: Secondary | ICD-10-CM | POA: Diagnosis not present

## 2022-07-12 DIAGNOSIS — Z7401 Bed confinement status: Secondary | ICD-10-CM | POA: Diagnosis not present

## 2022-07-12 DIAGNOSIS — R5383 Other fatigue: Secondary | ICD-10-CM | POA: Diagnosis not present

## 2022-07-12 DIAGNOSIS — F331 Major depressive disorder, recurrent, moderate: Secondary | ICD-10-CM | POA: Diagnosis not present

## 2022-07-12 DIAGNOSIS — M6281 Muscle weakness (generalized): Secondary | ICD-10-CM | POA: Diagnosis not present

## 2022-07-12 DIAGNOSIS — G9341 Metabolic encephalopathy: Secondary | ICD-10-CM | POA: Diagnosis not present

## 2022-07-12 DIAGNOSIS — L853 Xerosis cutis: Secondary | ICD-10-CM | POA: Diagnosis not present

## 2022-07-12 DIAGNOSIS — I10 Essential (primary) hypertension: Secondary | ICD-10-CM | POA: Diagnosis not present

## 2022-07-12 DIAGNOSIS — D519 Vitamin B12 deficiency anemia, unspecified: Secondary | ICD-10-CM | POA: Diagnosis not present

## 2022-07-12 DIAGNOSIS — R4589 Other symptoms and signs involving emotional state: Secondary | ICD-10-CM | POA: Diagnosis not present

## 2022-07-12 DIAGNOSIS — R404 Transient alteration of awareness: Secondary | ICD-10-CM | POA: Diagnosis not present

## 2022-07-12 DIAGNOSIS — N189 Chronic kidney disease, unspecified: Secondary | ICD-10-CM | POA: Diagnosis not present

## 2022-07-12 DIAGNOSIS — R1312 Dysphagia, oropharyngeal phase: Secondary | ICD-10-CM | POA: Diagnosis not present

## 2022-07-12 DIAGNOSIS — E7800 Pure hypercholesterolemia, unspecified: Secondary | ICD-10-CM | POA: Diagnosis not present

## 2022-07-12 DIAGNOSIS — F02B18 Dementia in other diseases classified elsewhere, moderate, with other behavioral disturbance: Secondary | ICD-10-CM | POA: Diagnosis not present

## 2022-07-12 DIAGNOSIS — R41841 Cognitive communication deficit: Secondary | ICD-10-CM | POA: Diagnosis not present

## 2022-07-12 DIAGNOSIS — F0390 Unspecified dementia without behavioral disturbance: Secondary | ICD-10-CM | POA: Diagnosis not present

## 2022-07-12 DIAGNOSIS — J189 Pneumonia, unspecified organism: Secondary | ICD-10-CM | POA: Diagnosis not present

## 2022-07-12 DIAGNOSIS — B370 Candidal stomatitis: Secondary | ICD-10-CM | POA: Diagnosis not present

## 2022-07-12 DIAGNOSIS — G928 Other toxic encephalopathy: Secondary | ICD-10-CM | POA: Diagnosis not present

## 2022-07-12 DIAGNOSIS — R630 Anorexia: Secondary | ICD-10-CM | POA: Diagnosis not present

## 2022-07-12 DIAGNOSIS — F419 Anxiety disorder, unspecified: Secondary | ICD-10-CM | POA: Diagnosis not present

## 2022-07-12 DIAGNOSIS — E440 Moderate protein-calorie malnutrition: Secondary | ICD-10-CM | POA: Diagnosis not present

## 2022-07-12 DIAGNOSIS — R7989 Other specified abnormal findings of blood chemistry: Secondary | ICD-10-CM | POA: Diagnosis not present

## 2022-07-12 DIAGNOSIS — N1831 Chronic kidney disease, stage 3a: Secondary | ICD-10-CM | POA: Diagnosis not present

## 2022-07-12 DIAGNOSIS — F0393 Unspecified dementia, unspecified severity, with mood disturbance: Secondary | ICD-10-CM | POA: Diagnosis not present

## 2022-07-12 DIAGNOSIS — Z720 Tobacco use: Secondary | ICD-10-CM | POA: Diagnosis not present

## 2022-07-12 LAB — BASIC METABOLIC PANEL
Anion gap: 14 (ref 5–15)
BUN: 10 mg/dL (ref 8–23)
CO2: 20 mmol/L — ABNORMAL LOW (ref 22–32)
Calcium: 8.6 mg/dL — ABNORMAL LOW (ref 8.9–10.3)
Chloride: 106 mmol/L (ref 98–111)
Creatinine, Ser: 1.1 mg/dL (ref 0.61–1.24)
GFR, Estimated: >60 mL/min (ref >60)
Glucose, Bld: 89 mg/dL (ref 70–99)
Potassium: 3.8 mmol/L (ref 3.5–5.1)
Sodium: 140 mmol/L (ref 135–145)

## 2022-07-12 NOTE — TOC Progression Note (Signed)
Transition of Care [facility]) - Progression Note    Patient Details  Name: Eddie Morris MRN: [id number] Date of Birth: 25-Mar-1951  Transition of Care [facility]) CM/SW A[place], LCSW Phone Number: 07/12/2022, 9:05 AM  Clinical Narrative:    Insurance approval received for [facility]: Ref# [id number] ID# [id number], effective 07/12/2022-07/14/2022.     Barriers to Discharge: Ship broker  Expected Discharge Plan and Services   In-house Referral: Clinical Social Work     Living arrangements for the past 2 months: Single Family Home                                       Social Determinants of Health (SDOH) Interventions    Readmission Risk Interventions     No data to display

## 2022-07-12 NOTE — TOC Transition Note (Signed)
Transition of Care [facility]) - CM/SW Discharge Note   Patient Details  Name: Eddie Morris MRN: [id number] Date of Birth: 19-Apr-1951  Transition of Care [facility]) CM/SW Contact:  Benard Halsted, LCSW Phone Number: 07/12/2022, 1:16 PM   Clinical Narrative:    Patient will DC to: [facility] Anticipated DC date: 07/12/22 Family notified: Spouse Transport by: Corey Harold   Per MD patient ready for DC to [facility]. RN to call report prior to discharge [phone] room 123). RN, patient, patient's family, and facility notified of DC. Discharge Summary and FL2 sent to facility. DC packet on chart including signed DNR. Ambulance transport requested for patient.   CSW will sign off for now as social work intervention is no longer needed. Please consult [place] again if new needs arise.     Final next level of care: Skilled Nursing Facility Barriers to Discharge: Barriers Resolved   Patient Goals and CMS Choice Patient states their goals for this hospitalization and ongoing recovery are:: Be able to walk and return home CMS Medicare.gov Compare Post Acute Care list provided to:: Patient Represenative (must comment) Choice offered to / list presented to : Spouse  Discharge Placement   Existing PASRR number confirmed : 07/12/22          Patient chooses bed at: [facility] Patient to be transferred to facility by: [place] Name of family member notified: Debbie Patient and family notified of of transfer: 07/12/22  Discharge Plan and Services In-house Referral: Clinical Social Work                                   Social Determinants of Health ([place]) Interventions     Readmission Risk Interventions     No data to display
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
## 2022-07-12 NOTE — Discharge Instructions (Signed)
Follow with Primary MD Seward Carol, MD in 7 days   Get CBC, CMP, 2 view Chest X ray -  checked next visit within 1 week by Primary MD or SNF MD    Activity: As tolerated with Full fall precautions use walker/cane & assistance as needed  Disposition SNF  Diet: Dysphagia 3 with feeding assistance and aspiration precautions.    Special Instructions: If you have smoked or chewed Tobacco  in the last 2 yrs please stop smoking, stop any regular Alcohol  and or any Recreational drug use.  On your next visit with your primary care physician please Get Medicines reviewed and adjusted.  Please request your Prim.MD to go over all Hospital Tests and Procedure/Radiological results at the follow up, please get all Hospital records sent to your Prim MD by signing hospital release before you go home.  If you experience worsening of your admission symptoms, develop shortness of breath, life threatening emergency, suicidal or homicidal thoughts you must seek medical attention immediately by calling 911 or calling your MD immediately  if symptoms less severe.  You Must read complete instructions/literature along with all the possible adverse reactions/side effects for all the Medicines you take and that have been prescribed to you. Take any new Medicines after you have completely understood and accpet all the possible adverse reactions/side effects.

## 2022-07-13 DIAGNOSIS — N1831 Chronic kidney disease, stage 3a: Secondary | ICD-10-CM | POA: Diagnosis not present

## 2022-07-13 DIAGNOSIS — M6281 Muscle weakness (generalized): Secondary | ICD-10-CM | POA: Diagnosis not present

## 2022-07-13 DIAGNOSIS — I10 Essential (primary) hypertension: Secondary | ICD-10-CM | POA: Diagnosis not present

## 2022-07-13 DIAGNOSIS — E440 Moderate protein-calorie malnutrition: Secondary | ICD-10-CM | POA: Diagnosis not present

## 2022-07-13 DIAGNOSIS — F02B18 Dementia in other diseases classified elsewhere, moderate, with other behavioral disturbance: Secondary | ICD-10-CM | POA: Diagnosis not present

## 2022-07-13 DIAGNOSIS — D519 Vitamin B12 deficiency anemia, unspecified: Secondary | ICD-10-CM | POA: Diagnosis not present

## 2022-07-13 DIAGNOSIS — E7800 Pure hypercholesterolemia, unspecified: Secondary | ICD-10-CM | POA: Diagnosis not present

## 2022-07-13 DIAGNOSIS — E785 Hyperlipidemia, unspecified: Secondary | ICD-10-CM | POA: Diagnosis not present

## 2022-07-14 DIAGNOSIS — B370 Candidal stomatitis: Secondary | ICD-10-CM | POA: Diagnosis not present

## 2022-07-15 DIAGNOSIS — I10 Essential (primary) hypertension: Secondary | ICD-10-CM | POA: Diagnosis not present

## 2022-07-15 DIAGNOSIS — Z720 Tobacco use: Secondary | ICD-10-CM | POA: Diagnosis not present

## 2022-07-15 DIAGNOSIS — N189 Chronic kidney disease, unspecified: Secondary | ICD-10-CM | POA: Diagnosis not present

## 2022-07-15 DIAGNOSIS — N1831 Chronic kidney disease, stage 3a: Secondary | ICD-10-CM | POA: Diagnosis not present

## 2022-07-15 DIAGNOSIS — M6281 Muscle weakness (generalized): Secondary | ICD-10-CM | POA: Diagnosis not present

## 2022-07-15 DIAGNOSIS — F02B18 Dementia in other diseases classified elsewhere, moderate, with other behavioral disturbance: Secondary | ICD-10-CM | POA: Diagnosis not present

## 2022-07-15 DIAGNOSIS — B370 Candidal stomatitis: Secondary | ICD-10-CM | POA: Diagnosis not present

## 2022-07-15 DIAGNOSIS — F0390 Unspecified dementia without behavioral disturbance: Secondary | ICD-10-CM | POA: Diagnosis not present

## 2022-07-15 DIAGNOSIS — D519 Vitamin B12 deficiency anemia, unspecified: Secondary | ICD-10-CM | POA: Diagnosis not present

## 2022-07-19 DIAGNOSIS — N1831 Chronic kidney disease, stage 3a: Secondary | ICD-10-CM | POA: Diagnosis not present

## 2022-07-19 DIAGNOSIS — R7989 Other specified abnormal findings of blood chemistry: Secondary | ICD-10-CM | POA: Diagnosis not present

## 2022-07-25 LAB — CULTURE, FUNGUS WITHOUT SMEAR

## 2022-07-26 DIAGNOSIS — F02B18 Dementia in other diseases classified elsewhere, moderate, with other behavioral disturbance: Secondary | ICD-10-CM | POA: Diagnosis not present

## 2022-07-26 DIAGNOSIS — M6281 Muscle weakness (generalized): Secondary | ICD-10-CM | POA: Diagnosis not present

## 2022-07-26 DIAGNOSIS — R5383 Other fatigue: Secondary | ICD-10-CM | POA: Diagnosis not present

## 2022-07-26 DIAGNOSIS — R630 Anorexia: Secondary | ICD-10-CM | POA: Diagnosis not present

## 2022-07-28 DIAGNOSIS — N1831 Chronic kidney disease, stage 3a: Secondary | ICD-10-CM | POA: Diagnosis not present

## 2022-07-28 DIAGNOSIS — F02B18 Dementia in other diseases classified elsewhere, moderate, with other behavioral disturbance: Secondary | ICD-10-CM | POA: Diagnosis not present

## 2022-07-28 DIAGNOSIS — M6281 Muscle weakness (generalized): Secondary | ICD-10-CM | POA: Diagnosis not present

## 2022-07-28 DIAGNOSIS — D519 Vitamin B12 deficiency anemia, unspecified: Secondary | ICD-10-CM | POA: Diagnosis not present

## 2022-07-29 DIAGNOSIS — L853 Xerosis cutis: Secondary | ICD-10-CM | POA: Diagnosis not present

## 2022-08-05 DIAGNOSIS — M6281 Muscle weakness (generalized): Secondary | ICD-10-CM | POA: Diagnosis not present

## 2022-08-05 DIAGNOSIS — I10 Essential (primary) hypertension: Secondary | ICD-10-CM | POA: Diagnosis not present

## 2022-08-05 DIAGNOSIS — N189 Chronic kidney disease, unspecified: Secondary | ICD-10-CM | POA: Diagnosis not present

## 2022-08-05 DIAGNOSIS — R4589 Other symptoms and signs involving emotional state: Secondary | ICD-10-CM | POA: Diagnosis not present

## 2022-08-05 DIAGNOSIS — F02B18 Dementia in other diseases classified elsewhere, moderate, with other behavioral disturbance: Secondary | ICD-10-CM | POA: Diagnosis not present

## 2022-08-09 ENCOUNTER — Emergency Department (HOSPITAL_COMMUNITY): Payer: Medicare PPO

## 2022-08-09 ENCOUNTER — Other Ambulatory Visit: Payer: Self-pay

## 2022-08-09 ENCOUNTER — Emergency Department (HOSPITAL_COMMUNITY)
Admission: EM | Admit: 2022-08-09 | Discharge: 2022-08-10 | Disposition: A | Discharge status: Home / Self Care | Payer: Medicare PPO | Attending: Emergency Medicine | Admitting: Emergency Medicine

## 2022-08-09 ENCOUNTER — Encounter (HOSPITAL_COMMUNITY): Payer: Self-pay

## 2022-08-09 DIAGNOSIS — S199XXA Unspecified injury of neck, initial encounter: Secondary | ICD-10-CM | POA: Diagnosis not present

## 2022-08-09 DIAGNOSIS — F03C0 Unspecified dementia, severe, without behavioral disturbance, psychotic disturbance, mood disturbance, and anxiety: Secondary | ICD-10-CM | POA: Insufficient documentation

## 2022-08-09 DIAGNOSIS — R531 Weakness: Secondary | ICD-10-CM | POA: Diagnosis not present

## 2022-08-09 DIAGNOSIS — Z79899 Other long term (current) drug therapy: Secondary | ICD-10-CM | POA: Diagnosis not present

## 2022-08-09 DIAGNOSIS — J984 Other disorders of lung: Secondary | ICD-10-CM | POA: Diagnosis not present

## 2022-08-09 DIAGNOSIS — I129 Hypertensive chronic kidney disease with stage 1 through stage 4 chronic kidney disease, or unspecified chronic kidney disease: Secondary | ICD-10-CM | POA: Insufficient documentation

## 2022-08-09 DIAGNOSIS — T68XXXA Hypothermia, initial encounter: Secondary | ICD-10-CM | POA: Diagnosis not present

## 2022-08-09 DIAGNOSIS — N189 Chronic kidney disease, unspecified: Secondary | ICD-10-CM | POA: Diagnosis not present

## 2022-08-09 DIAGNOSIS — W19XXXA Unspecified fall, initial encounter: Secondary | ICD-10-CM | POA: Diagnosis not present

## 2022-08-09 DIAGNOSIS — I10 Essential (primary) hypertension: Secondary | ICD-10-CM | POA: Diagnosis not present

## 2022-08-09 DIAGNOSIS — R519 Headache, unspecified: Secondary | ICD-10-CM | POA: Diagnosis not present

## 2022-08-09 DIAGNOSIS — F0390 Unspecified dementia without behavioral disturbance: Secondary | ICD-10-CM | POA: Diagnosis not present

## 2022-08-09 DIAGNOSIS — Z1152 Encounter for screening for COVID-19: Secondary | ICD-10-CM | POA: Diagnosis not present

## 2022-08-09 DIAGNOSIS — I959 Hypotension, unspecified: Secondary | ICD-10-CM | POA: Diagnosis not present

## 2022-08-09 DIAGNOSIS — R918 Other nonspecific abnormal finding of lung field: Secondary | ICD-10-CM | POA: Diagnosis not present

## 2022-08-09 DIAGNOSIS — M25552 Pain in left hip: Secondary | ICD-10-CM | POA: Diagnosis not present

## 2022-08-09 LAB — CBC WITH DIFFERENTIAL/PLATELET
Abs Immature Granulocytes: 0.02 10*3/uL (ref 0.00–0.07)
Basophils Absolute: 0 10*3/uL (ref 0.0–0.1)
Basophils Relative: 0 %
Eosinophils Absolute: 0.1 10*3/uL (ref 0.0–0.5)
Eosinophils Relative: 1 %
HCT: 39.2 % (ref 39.0–52.0)
Hemoglobin: 13 g/dL (ref 13.0–17.0)
Immature Granulocytes: 0 %
Lymphocytes Relative: 8 %
Lymphs Abs: 0.7 10*3/uL (ref 0.7–4.0)
MCH: 31.3 pg (ref 26.0–34.0)
MCHC: 33.2 g/dL (ref 30.0–36.0)
MCV: 94.2 fL (ref 80.0–100.0)
Monocytes Absolute: 0.6 10*3/uL (ref 0.1–1.0)
Monocytes Relative: 8 %
Neutro Abs: 6.7 10*3/uL (ref 1.7–7.7)
Neutrophils Relative %: 83 %
Platelets: 223 10*3/uL (ref 150–400)
RBC: 4.16 MIL/uL — ABNORMAL LOW (ref 4.22–5.81)
RDW: 14.1 % (ref 11.5–15.5)
WBC: 8.1 10*3/uL (ref 4.0–10.5)
nRBC: 0 % (ref 0.0–0.2)

## 2022-08-09 LAB — COMPREHENSIVE METABOLIC PANEL
ALT: 21 U/L (ref 0–44)
AST: 33 U/L (ref 15–41)
Albumin: 3.6 g/dL (ref 3.5–5.0)
Alkaline Phosphatase: 46 U/L (ref 38–126)
Anion gap: 13 (ref 5–15)
BUN: 6 mg/dL — ABNORMAL LOW (ref 8–23)
CO2: 22 mmol/L (ref 22–32)
Calcium: 9 mg/dL (ref 8.9–10.3)
Chloride: 105 mmol/L (ref 98–111)
Creatinine, Ser: 1.03 mg/dL (ref 0.61–1.24)
GFR, Estimated: >60 mL/min (ref >60)
Glucose, Bld: 93 mg/dL (ref 70–99)
Potassium: 3.3 mmol/L — ABNORMAL LOW (ref 3.5–5.1)
Sodium: 140 mmol/L (ref 135–145)
Total Bilirubin: 1.5 mg/dL — ABNORMAL HIGH (ref 0.3–1.2)
Total Protein: 6.7 g/dL (ref 6.5–8.1)

## 2022-08-09 LAB — RESP PANEL BY RT-PCR (FLU A&B, COVID) ARPGX2
Influenza A by PCR: NEGATIVE
Influenza B by PCR: NEGATIVE
SARS Coronavirus 2 by RT PCR: NEGATIVE

## 2022-08-09 LAB — TROPONIN I (HIGH SENSITIVITY)
Troponin I (High Sensitivity): 6 ng/L (ref <18)
Troponin I (High Sensitivity): 5 ng/L (ref <18)

## 2022-08-09 MED ORDER — AMLODIPINE BESYLATE 5 MG PO TABS
10.0000 mg | ORAL_TABLET | Freq: Every day | ORAL | Status: DC
  Administered 2022-08-10: 10 mg via ORAL
  Filled 2022-08-09: qty 2

## 2022-08-09 MED ORDER — VITAMIN B-12 1000 MCG PO TABS
500.0000 ug | ORAL_TABLET | Freq: Every day | ORAL | Status: DC
  Administered 2022-08-09 – 2022-08-10 (×2): 500 ug via ORAL
  Filled 2022-08-09: qty 1
  Filled 2022-08-09: qty 5

## 2022-08-09 MED ORDER — MEMANTINE HCL 10 MG PO TABS
10.0000 mg | ORAL_TABLET | Freq: Two times a day (BID) | ORAL | Status: DC
  Administered 2022-08-09 – 2022-08-10 (×2): 10 mg via ORAL
  Filled 2022-08-09 (×2): qty 1

## 2022-08-09 MED ORDER — ROSUVASTATIN CALCIUM 5 MG PO TABS
10.0000 mg | ORAL_TABLET | Freq: Every day | ORAL | Status: DC
  Administered 2022-08-09 – 2022-08-10 (×2): 10 mg via ORAL
  Filled 2022-08-09 (×2): qty 2

## 2022-08-09 NOTE — ED Notes (Signed)
Patient placed into a gown, and vitals taken. Patient unable to answer orientation questions or follow commands at this time.

## 2022-08-09 NOTE — ED Notes (Signed)
The patient was transported to CT.
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
## 2022-08-09 NOTE — ED Triage Notes (Signed)
Pt arrives via EMS from home. PT has hx of dementia. The patient's wife was concerned that he had fallen earlier this morning. PT was laying on the couch. Pt displayed some concerned for left hip pain. Pt is not on blood thinners. Currently at his baseline as far as mental status goes.

## 2022-08-09 NOTE — ED Notes (Signed)
PT to imaging
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
## 2022-08-09 NOTE — Progress Notes (Signed)
CSW asked provider to put in PT order.

## 2022-08-09 NOTE — ED Notes (Signed)
Wife at bedside feeding patient.

## 2022-08-10 DIAGNOSIS — R531 Weakness: Secondary | ICD-10-CM | POA: Diagnosis not present

## 2022-08-10 DIAGNOSIS — Z7401 Bed confinement status: Secondary | ICD-10-CM | POA: Diagnosis not present

## 2022-08-10 LAB — URINALYSIS, ROUTINE W REFLEX MICROSCOPIC
Bilirubin Urine: NEGATIVE
Glucose, UA: NEGATIVE mg/dL
Ketones, ur: 20 mg/dL — AB
Nitrite: NEGATIVE
Protein, ur: NEGATIVE mg/dL
RBC / HPF: >50 RBC/hpf — ABNORMAL HIGH (ref 0–5)
Specific Gravity, Urine: 1.012 (ref 1.005–1.030)
pH: 6 (ref 5.0–8.0)

## 2022-08-10 NOTE — ED Notes (Signed)
Lunch provided. Wife at bedside feeding pt.

## 2022-08-10 NOTE — ED Notes (Signed)
Pt's wife at bedside.

## 2022-08-10 NOTE — ED Provider Notes (Addendum)
Emergency Medicine Observation Re-evaluation Note  Felis Quillin is a 71 y.o. male, seen on rounds today.  Pt initially presented to the ED for complaints of Fall Currently, the patient is asleep.  Pt presented yesterday with his wife unable to care for him at home.  Pt was d/c from SNF on Sunday (11/19) and wife thought it was too soon.  Physical Exam  BP (!) 150/82 (BP Location: Right Arm)   Pulse 75   Temp 98.3 F (36.8 C) (Oral)   Resp 16   SpO2 100%  Physical Exam General: asleep Cardiac: rr Lungs: clear Psych: asleep  ED Course / MDM  EKG:EKG Interpretation  Date/Time:  Tuesday August 09 2022 10:55:33 EST Ventricular Rate:  62 PR Interval:  192 QRS Duration: 101 QT Interval:  425 QTC Calculation: 432 R Axis:   -60 Text Interpretation: Sinus rhythm Atrial premature complexes Probable left atrial enlargement Incomplete RBBB and LAFB RSR' in V1 or V2, right VCD or RVH Left ventricular hypertrophy Nonspecific T abnormalities, lateral leads TW abnormalities new from prior Confirmed by Alvira Monday ([id number]) on 08/09/2022 11:36:28 AM  I have reviewed the labs performed to date as well as medications administered while in observation.  Recent changes in the last 24 hours include TOC and PT consulted.  Diet ordered.  Home meds ordered.  Plan  Current plan is for placement.    Jacalyn Lefevre, MD 08/10/22 0756  SW and PT did see pt.  Wife does not want to pay for additional time at SNF and will hire a private CNA.  HH has been ordered already.  Pt is stable for d/c home with wife.   Jacalyn Lefevre, MD 08/10/22 1334

## 2022-08-10 NOTE — ED Notes (Signed)
[facility] called; patient is sixth in line, ETA possibly within the hour

## 2022-08-10 NOTE — Evaluation (Signed)
Physical Therapy Evaluation Patient Details Name: Eddie Morris MRN: [id number] DOB: [birth year] Today's Date: 08/10/2022  History of Present Illness  Pt is 71 yo male admitted 08/09/22 after fall at home and wife unable to care for him at home.  Pt recently admitted 10/23 with toxic metabolic encephalopathy with dementia and PNE - at that time he went to SNF at d/c. Pt was d/c form SNF on 08/07/22 due to insurance.  Pt with hx of dementia, HLD, HTN, CKD.   Clinical Impression  Pt admitted with above diagnosis. Pt unable to provide PLOF due to dementia and AMS.  Per prior chart reviews it appears pt was from home with wife but had recent SNF stay.  After SNF was having difficulty managing at home with falls.  Today, pt lethargic and with AMS.  He was unable to follow any commands and resisted all movement despite allowing increased time, multimodal cues, and attempts to make functional.  Pt not safe to return home, but with guarded rehab potential due to dementia.  Do recommend SNF but may need to consider transition to LTC in future. Pt currently with functional limitations due to the deficits listed below (see PT Problem List). Pt will benefit from skilled PT to increase their independence and safety with mobility to allow discharge to the venue listed below.          Recommendations for follow up therapy are one component of a multi-disciplinary discharge planning process, led by the attending physician.  Recommendations may be updated based on patient status, additional functional criteria and insurance authorization.  Follow Up Recommendations Skilled nursing-short term rehab (<3 hours/day) (also may need to consider transisiton to LTC) Can patient physically be transported by private vehicle: No    Assistance Recommended at Discharge Frequent or constant Supervision/Assistance  Patient can return home with the following  Two people to help with walking and/or transfers;Two people to  help with bathing/dressing/bathroom    Equipment Recommendations Other (comment) (further assessment SNF; but if home hospital bed, w/c, hoyer)  Recommendations for Other Services       Functional Status Assessment Patient has had a recent decline in their functional status and demonstrates the ability to make significant improvements in function in a reasonable and predictable amount of time.     Precautions / Restrictions Precautions Precautions: Fall      Mobility  Bed Mobility Overal bed mobility: Needs Assistance Bed Mobility: Rolling Rolling: Total assist         General bed mobility comments: Allowed increased time, multimodal cues, and attempts to make functional but pt strongly resisting any movement.  Unable to do more than partial roll    Transfers                        Ambulation/Gait                  Stairs            Wheelchair Mobility    Modified Rankin (Stroke Patients Only)       Balance Overall balance assessment: History of Falls                                           Pertinent Vitals/Pain Pain Assessment Pain Assessment: Faces Faces Pain Scale: No hurt    Home Living Family/patient expects to be  discharged to:: Skilled nursing facility Living Arrangements: Spouse/significant other Available Help at Discharge: Family Type of Home: House Home Access: Stairs to enter   [employer] of Steps: unable to specify   Home Layout: Two level Home Equipment: Agricultural consultant (2 wheels) Additional Comments: All information from prior admission due to pt unable to provide and no family present.    Prior Function Prior Level of Function : Needs assist;Patient poor historian/Family not available             Mobility Comments: Per chart pt was ambulating with RW and admitted with a fall.  At last admission in October 2023 pt was mod A for 2 for transfers and had d/c to SNF.  Per TOC note,  pt d/c SNF on 11/19 and wife felt that was too early and they have had difficulty managing at home       Hand Dominance        Extremity/Trunk Assessment   Upper Extremity Assessment Upper Extremity Assessment: Difficult to assess due to impaired cognition (Pt resisting all movements despite increased time, multimodal cues, and attempt to make functional (reach for water). Strong resistance)    Lower Extremity Assessment Lower Extremity Assessment: Difficult to assess due to impaired cognition (Pt resisting all movements despite increased time, multimodal cues, and attempt to make functional (repositioning). Strong resistance)    Cervical / Trunk Assessment Cervical / Trunk Assessment: Other exceptions Cervical / Trunk Exceptions: forward head  Communication   Communication: Expressive difficulties;Receptive difficulties  Cognition Arousal/Alertness: Lethargic Behavior During Therapy: Flat affect Overall Cognitive Status: No family/caregiver present to determine baseline cognitive functioning                                 General Comments: Pt does have hx of dementia but unsure of baseline.  He was not following any commands today and only replied "yes" once.        General Comments      Exercises     Assessment/Plan    PT Assessment Patient needs continued PT services  PT Problem List Decreased strength;Decreased coordination;Decreased range of motion;Decreased cognition;Decreased activity tolerance;Decreased balance;Decreased mobility;Decreased knowledge of precautions;Decreased safety awareness;Decreased knowledge of use of DME       PT Treatment Interventions DME instruction;Therapeutic exercise;Wheelchair mobility training;Gait training;Balance training;Neuromuscular re-education;Functional mobility training;Cognitive remediation;Patient/family education;Therapeutic activities    PT Goals (Current goals can be found in the Care Plan section)  Acute  Rehab PT Goals Patient Stated Goal: unable to state; per notes wife wants SNF PT Goal Formulation: Patient unable to participate in goal setting Time For Goal Achievement: 08/24/22 Potential to Achieve Goals: Fair    Frequency Min 2X/week     Co-evaluation               AM-PAC PT "6 Clicks" Mobility  Outcome Measure Help needed turning from your back to your side while in a flat bed without using bedrails?: Total Help needed moving from lying on your back to sitting on the side of a flat bed without using bedrails?: Total Help needed moving to and from a bed to a chair (including a wheelchair)?: Total Help needed standing up from a chair using your arms (e.g., wheelchair or bedside chair)?: Total Help needed to walk in hospital room?: Total Help needed climbing 3-5 steps with a railing? : Total 6 Click Score: 6    End of Session   Activity Tolerance:  Other (comment) (limited by ams) Patient left: in bed;Other (comment) (ED on hallway stretcher) Nurse Communication: Mobility status PT Visit Diagnosis: Other abnormalities of gait and mobility (R26.89);History of falling (Z91.81)    Time: [id number] PT Time Calculation (min) (ACUTE ONLY): 20 min   Charges:   PT Evaluation $PT Eval Low Complexity: 1 Low          Rainbow Salman, PT Acute Rehab [facility] [phone]   Karlton Lemon 08/10/2022, 11:06 AM

## 2022-08-10 NOTE — Progress Notes (Addendum)
1:15pm: CSW spoke with patient's wife Eunice Blase who states she does not want to pay for additional time at SNF but instead will hire someone privately for assistance in the home. Patient is active with [facility] for home health PT and OT. Eunice Blase states she has spoken with a [facility] rep and that the initial intake appointment is scheduled for this coming Saturday. Debbie requesting patient be sent home via [facility] and she will meet him there.  CSW informed RN of information.  CSW spoke with Tresa Endo of [facility] to inform her of information.  12pm: CSW spoke with PT who is recommending SNF.  CSW spoke with patient's wife Eunice Blase who states she cannot care for the patient at home due to being unable to lift him and requests he go back to SNF. Debbie reports she wants to speak with the finance office at the facility to attempt to work out a payment plan for the copay day amount.  CSW informed Kia at [facility] who will inform Gearldine Bienenstock of finance of the request.  10am: CSW spoke with Kia at [facility] who states patient was recently discharged on 08/07/2022 from the facility due to insurance. Kia reports patient is eligible to return but will be in co-pay days ($327 a day) for additional rehab.  CSW will speak with PT to determine their recommendations and will speak with patient's wife after.  Edwin Dada, MSW, LCSW Transitions of Care  Clinical Social Worker II [phone]

## 2022-09-29 DIAGNOSIS — I455 Other specified heart block: Secondary | ICD-10-CM | POA: Diagnosis not present

## 2022-09-29 DIAGNOSIS — F0390 Unspecified dementia without behavioral disturbance: Secondary | ICD-10-CM | POA: Diagnosis not present

## 2022-09-29 DIAGNOSIS — E7800 Pure hypercholesterolemia, unspecified: Secondary | ICD-10-CM | POA: Diagnosis not present

## 2022-09-29 DIAGNOSIS — Z8701 Personal history of pneumonia (recurrent): Secondary | ICD-10-CM | POA: Diagnosis not present

## 2022-09-29 DIAGNOSIS — Z23 Encounter for immunization: Secondary | ICD-10-CM | POA: Diagnosis not present

## 2022-09-29 DIAGNOSIS — Z7189 Other specified counseling: Secondary | ICD-10-CM | POA: Diagnosis not present

## 2022-09-29 DIAGNOSIS — N1831 Chronic kidney disease, stage 3a: Secondary | ICD-10-CM | POA: Diagnosis not present

## 2022-09-29 DIAGNOSIS — G9340 Encephalopathy, unspecified: Secondary | ICD-10-CM | POA: Diagnosis not present

## 2022-10-08 DIAGNOSIS — M5136 Other intervertebral disc degeneration, lumbar region: Secondary | ICD-10-CM | POA: Diagnosis not present

## 2022-10-08 DIAGNOSIS — R260 Ataxic gait: Secondary | ICD-10-CM | POA: Diagnosis not present

## 2022-10-08 DIAGNOSIS — F02B18 Dementia in other diseases classified elsewhere, moderate, with other behavioral disturbance: Secondary | ICD-10-CM | POA: Diagnosis not present

## 2022-10-08 DIAGNOSIS — M6281 Muscle weakness (generalized): Secondary | ICD-10-CM | POA: Diagnosis not present

## 2022-10-08 DIAGNOSIS — G9341 Metabolic encephalopathy: Secondary | ICD-10-CM | POA: Diagnosis not present

## 2022-10-08 DIAGNOSIS — G4089 Other seizures: Secondary | ICD-10-CM | POA: Diagnosis not present

## 2022-12-07 DIAGNOSIS — F02B18 Dementia in other diseases classified elsewhere, moderate, with other behavioral disturbance: Secondary | ICD-10-CM | POA: Diagnosis not present

## 2022-12-07 DIAGNOSIS — G9341 Metabolic encephalopathy: Secondary | ICD-10-CM | POA: Diagnosis not present

## 2022-12-07 DIAGNOSIS — R260 Ataxic gait: Secondary | ICD-10-CM | POA: Diagnosis not present

## 2022-12-07 DIAGNOSIS — M5136 Other intervertebral disc degeneration, lumbar region: Secondary | ICD-10-CM | POA: Diagnosis not present

## 2022-12-07 DIAGNOSIS — G4089 Other seizures: Secondary | ICD-10-CM | POA: Diagnosis not present

## 2022-12-07 DIAGNOSIS — M6281 Muscle weakness (generalized): Secondary | ICD-10-CM | POA: Diagnosis not present

## 2022-12-29 ENCOUNTER — Ambulatory Visit
Admission: RE | Admit: 2022-12-29 | Discharge: 2022-12-29 | Disposition: A | Discharge status: Home / Self Care | Payer: Medicare PPO | Source: Ambulatory Visit | Attending: Internal Medicine | Admitting: Internal Medicine

## 2022-12-29 ENCOUNTER — Other Ambulatory Visit: Payer: Self-pay | Admitting: Internal Medicine

## 2022-12-29 DIAGNOSIS — I10 Essential (primary) hypertension: Secondary | ICD-10-CM | POA: Diagnosis not present

## 2022-12-29 DIAGNOSIS — R058 Other specified cough: Secondary | ICD-10-CM

## 2022-12-29 DIAGNOSIS — F0390 Unspecified dementia without behavioral disturbance: Secondary | ICD-10-CM | POA: Diagnosis not present

## 2022-12-29 DIAGNOSIS — N1831 Chronic kidney disease, stage 3a: Secondary | ICD-10-CM | POA: Diagnosis not present

## 2022-12-29 DIAGNOSIS — R059 Cough, unspecified: Secondary | ICD-10-CM | POA: Diagnosis not present

## 2022-12-29 DIAGNOSIS — E7800 Pure hypercholesterolemia, unspecified: Secondary | ICD-10-CM | POA: Diagnosis not present

## 2022-12-29 DIAGNOSIS — Z5181 Encounter for therapeutic drug level monitoring: Secondary | ICD-10-CM | POA: Diagnosis not present

## 2023-01-07 DIAGNOSIS — M5136 Other intervertebral disc degeneration, lumbar region: Secondary | ICD-10-CM | POA: Diagnosis not present

## 2023-01-07 DIAGNOSIS — M6281 Muscle weakness (generalized): Secondary | ICD-10-CM | POA: Diagnosis not present

## 2023-01-07 DIAGNOSIS — G9341 Metabolic encephalopathy: Secondary | ICD-10-CM | POA: Diagnosis not present

## 2023-01-07 DIAGNOSIS — F02B18 Dementia in other diseases classified elsewhere, moderate, with other behavioral disturbance: Secondary | ICD-10-CM | POA: Diagnosis not present

## 2023-01-07 DIAGNOSIS — R260 Ataxic gait: Secondary | ICD-10-CM | POA: Diagnosis not present

## 2023-01-07 DIAGNOSIS — G4089 Other seizures: Secondary | ICD-10-CM | POA: Diagnosis not present

## 2023-02-06 DIAGNOSIS — G9341 Metabolic encephalopathy: Secondary | ICD-10-CM | POA: Diagnosis not present

## 2023-02-06 DIAGNOSIS — R260 Ataxic gait: Secondary | ICD-10-CM | POA: Diagnosis not present

## 2023-02-06 DIAGNOSIS — M5136 Other intervertebral disc degeneration, lumbar region: Secondary | ICD-10-CM | POA: Diagnosis not present

## 2023-02-06 DIAGNOSIS — G4089 Other seizures: Secondary | ICD-10-CM | POA: Diagnosis not present

## 2023-02-06 DIAGNOSIS — M6281 Muscle weakness (generalized): Secondary | ICD-10-CM | POA: Diagnosis not present

## 2023-02-06 DIAGNOSIS — F02B18 Dementia in other diseases classified elsewhere, moderate, with other behavioral disturbance: Secondary | ICD-10-CM | POA: Diagnosis not present

## 2023-03-08 ENCOUNTER — Ambulatory Visit: Payer: Medicare PPO | Admitting: Diagnostic Neuroimaging

## 2023-03-08 ENCOUNTER — Encounter: Payer: Self-pay | Admitting: Diagnostic Neuroimaging

## 2023-03-08 VITALS — BP 123/69 | HR 78 | Ht 72.0 in

## 2023-03-08 DIAGNOSIS — G301 Alzheimer's disease with late onset: Secondary | ICD-10-CM

## 2023-03-08 DIAGNOSIS — F02C0 Dementia in other diseases classified elsewhere, severe, without behavioral disturbance, psychotic disturbance, mood disturbance, and anxiety: Secondary | ICD-10-CM | POA: Diagnosis not present
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
## 2023-03-09 DIAGNOSIS — G9341 Metabolic encephalopathy: Secondary | ICD-10-CM | POA: Diagnosis not present

## 2023-03-09 DIAGNOSIS — R260 Ataxic gait: Secondary | ICD-10-CM | POA: Diagnosis not present

## 2023-03-09 DIAGNOSIS — M6281 Muscle weakness (generalized): Secondary | ICD-10-CM | POA: Diagnosis not present

## 2023-03-09 DIAGNOSIS — M5136 Other intervertebral disc degeneration, lumbar region: Secondary | ICD-10-CM | POA: Diagnosis not present

## 2023-03-09 DIAGNOSIS — G4089 Other seizures: Secondary | ICD-10-CM | POA: Diagnosis not present

## 2023-03-09 DIAGNOSIS — F02B18 Dementia in other diseases classified elsewhere, moderate, with other behavioral disturbance: Secondary | ICD-10-CM | POA: Diagnosis not present

## 2023-04-08 DIAGNOSIS — G4089 Other seizures: Secondary | ICD-10-CM | POA: Diagnosis not present

## 2023-04-08 DIAGNOSIS — M6281 Muscle weakness (generalized): Secondary | ICD-10-CM | POA: Diagnosis not present

## 2023-04-08 DIAGNOSIS — M5136 Other intervertebral disc degeneration, lumbar region: Secondary | ICD-10-CM | POA: Diagnosis not present

## 2023-04-08 DIAGNOSIS — R260 Ataxic gait: Secondary | ICD-10-CM | POA: Diagnosis not present

## 2023-04-08 DIAGNOSIS — F02B18 Dementia in other diseases classified elsewhere, moderate, with other behavioral disturbance: Secondary | ICD-10-CM | POA: Diagnosis not present

## 2023-04-08 DIAGNOSIS — G9341 Metabolic encephalopathy: Secondary | ICD-10-CM | POA: Diagnosis not present

## 2023-05-09 DIAGNOSIS — R260 Ataxic gait: Secondary | ICD-10-CM | POA: Diagnosis not present

## 2023-05-09 DIAGNOSIS — F02B18 Dementia in other diseases classified elsewhere, moderate, with other behavioral disturbance: Secondary | ICD-10-CM | POA: Diagnosis not present

## 2023-05-09 DIAGNOSIS — G9341 Metabolic encephalopathy: Secondary | ICD-10-CM | POA: Diagnosis not present

## 2023-05-09 DIAGNOSIS — M5136 Other intervertebral disc degeneration, lumbar region: Secondary | ICD-10-CM | POA: Diagnosis not present

## 2023-05-09 DIAGNOSIS — G4089 Other seizures: Secondary | ICD-10-CM | POA: Diagnosis not present

## 2023-05-09 DIAGNOSIS — M6281 Muscle weakness (generalized): Secondary | ICD-10-CM | POA: Diagnosis not present

## 2023-06-09 DIAGNOSIS — M6281 Muscle weakness (generalized): Secondary | ICD-10-CM | POA: Diagnosis not present

## 2023-06-09 DIAGNOSIS — G9341 Metabolic encephalopathy: Secondary | ICD-10-CM | POA: Diagnosis not present

## 2023-06-09 DIAGNOSIS — R260 Ataxic gait: Secondary | ICD-10-CM | POA: Diagnosis not present

## 2023-06-09 DIAGNOSIS — F02B18 Dementia in other diseases classified elsewhere, moderate, with other behavioral disturbance: Secondary | ICD-10-CM | POA: Diagnosis not present

## 2023-06-09 DIAGNOSIS — M5136 Other intervertebral disc degeneration, lumbar region: Secondary | ICD-10-CM | POA: Diagnosis not present

## 2023-06-09 DIAGNOSIS — G4089 Other seizures: Secondary | ICD-10-CM | POA: Diagnosis not present

## 2023-07-06 DIAGNOSIS — E7800 Pure hypercholesterolemia, unspecified: Secondary | ICD-10-CM | POA: Diagnosis not present

## 2023-07-06 DIAGNOSIS — Z23 Encounter for immunization: Secondary | ICD-10-CM | POA: Diagnosis not present

## 2023-07-06 DIAGNOSIS — F03911 Unspecified dementia, unspecified severity, with agitation: Secondary | ICD-10-CM | POA: Diagnosis not present

## 2023-07-06 DIAGNOSIS — I10 Essential (primary) hypertension: Secondary | ICD-10-CM | POA: Diagnosis not present

## 2023-07-06 DIAGNOSIS — G309 Alzheimer's disease, unspecified: Secondary | ICD-10-CM | POA: Diagnosis not present

## 2023-07-06 DIAGNOSIS — N1831 Chronic kidney disease, stage 3a: Secondary | ICD-10-CM | POA: Diagnosis not present

## 2023-07-06 DIAGNOSIS — Z5181 Encounter for therapeutic drug level monitoring: Secondary | ICD-10-CM | POA: Diagnosis not present

## 2023-07-09 DIAGNOSIS — R260 Ataxic gait: Secondary | ICD-10-CM | POA: Diagnosis not present

## 2023-07-09 DIAGNOSIS — M51369 Other intervertebral disc degeneration, lumbar region without mention of lumbar back pain or lower extremity pain: Secondary | ICD-10-CM | POA: Diagnosis not present

## 2023-07-09 DIAGNOSIS — F02B18 Dementia in other diseases classified elsewhere, moderate, with other behavioral disturbance: Secondary | ICD-10-CM | POA: Diagnosis not present

## 2023-07-09 DIAGNOSIS — G9341 Metabolic encephalopathy: Secondary | ICD-10-CM | POA: Diagnosis not present

## 2023-07-09 DIAGNOSIS — M6281 Muscle weakness (generalized): Secondary | ICD-10-CM | POA: Diagnosis not present

## 2023-07-09 DIAGNOSIS — G4089 Other seizures: Secondary | ICD-10-CM | POA: Diagnosis not present

## 2023-07-26 DIAGNOSIS — Z66 Do not resuscitate: Secondary | ICD-10-CM | POA: Diagnosis not present

## 2023-07-26 DIAGNOSIS — F0390 Unspecified dementia without behavioral disturbance: Secondary | ICD-10-CM | POA: Diagnosis not present

## 2023-08-09 DIAGNOSIS — R260 Ataxic gait: Secondary | ICD-10-CM | POA: Diagnosis not present

## 2023-08-09 DIAGNOSIS — G4089 Other seizures: Secondary | ICD-10-CM | POA: Diagnosis not present

## 2023-08-09 DIAGNOSIS — M6281 Muscle weakness (generalized): Secondary | ICD-10-CM | POA: Diagnosis not present

## 2023-08-09 DIAGNOSIS — F02B18 Dementia in other diseases classified elsewhere, moderate, with other behavioral disturbance: Secondary | ICD-10-CM | POA: Diagnosis not present

## 2023-08-09 DIAGNOSIS — G9341 Metabolic encephalopathy: Secondary | ICD-10-CM | POA: Diagnosis not present

## 2023-08-09 DIAGNOSIS — M51369 Other intervertebral disc degeneration, lumbar region without mention of lumbar back pain or lower extremity pain: Secondary | ICD-10-CM | POA: Diagnosis not present

## 2023-09-08 DIAGNOSIS — F02B18 Dementia in other diseases classified elsewhere, moderate, with other behavioral disturbance: Secondary | ICD-10-CM | POA: Diagnosis not present

## 2023-09-08 DIAGNOSIS — G4089 Other seizures: Secondary | ICD-10-CM | POA: Diagnosis not present

## 2023-09-08 DIAGNOSIS — R260 Ataxic gait: Secondary | ICD-10-CM | POA: Diagnosis not present

## 2023-09-08 DIAGNOSIS — M6281 Muscle weakness (generalized): Secondary | ICD-10-CM | POA: Diagnosis not present

## 2023-09-08 DIAGNOSIS — G9341 Metabolic encephalopathy: Secondary | ICD-10-CM | POA: Diagnosis not present

## 2023-09-08 DIAGNOSIS — M51369 Other intervertebral disc degeneration, lumbar region without mention of lumbar back pain or lower extremity pain: Secondary | ICD-10-CM | POA: Diagnosis not present

## 2023-11-19 ENCOUNTER — Other Ambulatory Visit: Payer: Self-pay

## 2023-11-19 ENCOUNTER — Emergency Department (HOSPITAL_COMMUNITY)
Admission: EM | Admit: 2023-11-19 | Discharge: 2023-11-20 | Disposition: A | Discharge status: Home / Self Care | Attending: Emergency Medicine | Admitting: Emergency Medicine

## 2023-11-19 ENCOUNTER — Encounter (HOSPITAL_COMMUNITY): Payer: Self-pay

## 2023-11-19 DIAGNOSIS — N281 Cyst of kidney, acquired: Secondary | ICD-10-CM | POA: Diagnosis not present

## 2023-11-19 DIAGNOSIS — Z91040 Latex allergy status: Secondary | ICD-10-CM | POA: Diagnosis not present

## 2023-11-19 DIAGNOSIS — J181 Lobar pneumonia, unspecified organism: Secondary | ICD-10-CM | POA: Diagnosis not present

## 2023-11-19 DIAGNOSIS — J189 Pneumonia, unspecified organism: Secondary | ICD-10-CM | POA: Diagnosis not present

## 2023-11-19 DIAGNOSIS — R109 Unspecified abdominal pain: Secondary | ICD-10-CM | POA: Diagnosis not present

## 2023-11-19 DIAGNOSIS — R319 Hematuria, unspecified: Secondary | ICD-10-CM | POA: Diagnosis not present

## 2023-11-19 DIAGNOSIS — K5900 Constipation, unspecified: Secondary | ICD-10-CM | POA: Diagnosis not present

## 2023-11-19 DIAGNOSIS — F0390 Unspecified dementia without behavioral disturbance: Secondary | ICD-10-CM | POA: Diagnosis not present

## 2023-11-19 DIAGNOSIS — R059 Cough, unspecified: Secondary | ICD-10-CM | POA: Diagnosis present

## 2023-11-19 NOTE — ED Triage Notes (Signed)
 Pt BIB family from home for possible hematuria. H/x dementia. Pt family reports that when she went to change the patient before bed, she saw "a big puddle of blood" and she couldn't retract the foreskin r/t penile swelling.

## 2023-11-20 ENCOUNTER — Emergency Department (HOSPITAL_COMMUNITY)

## 2023-11-20 DIAGNOSIS — R109 Unspecified abdominal pain: Secondary | ICD-10-CM | POA: Diagnosis not present

## 2023-11-20 DIAGNOSIS — R319 Hematuria, unspecified: Secondary | ICD-10-CM | POA: Diagnosis not present

## 2023-11-20 DIAGNOSIS — N281 Cyst of kidney, acquired: Secondary | ICD-10-CM | POA: Diagnosis not present

## 2023-11-20 MED ORDER — CEPHALEXIN 500 MG PO CAPS: 500.0000 mg | ORAL_CAPSULE | Freq: Four times a day (QID) | ORAL | 0 refills | Status: AC

## 2023-11-20 MED ORDER — AZITHROMYCIN 250 MG PO TABS: 250.0000 mg | ORAL_TABLET | Freq: Every day | ORAL | 0 refills | Status: AC

## 2023-11-20 NOTE — ED Notes (Signed)
 No current output, will continue to monitor, provided pt with water.

## 2023-11-20 NOTE — ED Provider Notes (Signed)
 [place] EMERGENCY DEPARTMENT AT [facility] Provider Note   CSN: [id number] Arrival date & time: 11/19/23  2256     History  Chief Complaint  Patient presents with   Hematuria    Eddie Morris is a 73 y.o. male.  73 yo M with a chief complaints of blood noted in his diaper.  Family thinks that he has blood coming from his penis.  They were unable to retract the foreskin and it sounds like it has been retracted in some time.  He has been at his baseline today.  Significant dementia which requires help with all of his activities of daily living.   Hematuria       Home Medications Prior to Admission medications   Medication Sig Start Date End Date Taking? Authorizing Provider  azithromycin (ZITHROMAX) 250 MG tablet Take 1 tablet (250 mg total) by mouth daily. Take first 2 tablets together, then 1 every day until finished. 11/20/23  Yes Melene Plan, DO  cephALEXin (KEFLEX) 500 MG capsule Take 1 capsule (500 mg total) by mouth 4 (four) times daily. 11/20/23  Yes Melene Plan, DO  amLODipine (NORVASC) 10 MG tablet Take 10 mg by mouth daily. 03/12/18   [provider]  Melatonin 10 MG TABS Take 10 mg by mouth at bedtime.    [provider]  rosuvastatin (CRESTOR) 10 MG tablet Take 10 mg by mouth daily. 11/13/19   [provider]      Allergies    Latex    Review of Systems   Review of Systems  Genitourinary:  Positive for hematuria.    Physical Exam Updated Vital Signs BP (!) 162/76 (BP Location: Left Arm)   Pulse 60   Temp 97.6 F (36.4 C) (Axillary)   Resp 18   Ht 6' (1.829 m)   Wt 79.4 kg   SpO2 100%   BMI 23.73 kg/m  Physical Exam Vitals and nursing note reviewed.  Constitutional:      Appearance: He is well-developed.  HENT:     Head: Normocephalic and atraumatic.  Eyes:     Pupils: Pupils are equal, round, and reactive to light.  Neck:     Vascular: No JVD.  Cardiovascular:     Rate and Rhythm: Normal rate and  regular rhythm.     Heart sounds: No murmur heard.    No friction rub. No gallop.  Pulmonary:     Effort: No respiratory distress.     Breath sounds: No wheezing.  Abdominal:     General: There is no distension.     Tenderness: There is no abdominal tenderness. There is no guarding or rebound.  Genitourinary:    Comments: Small amount of liquid at the small opening that is visible at the end of the foreskin.  Is bloody with dab on the undergarment.  No obvious discomfort.  No obvious erythema or warmth.  No edema to the penis. Musculoskeletal:        General: Normal range of motion.     Cervical back: Normal range of motion and neck supple.  Skin:    Coloration: Skin is not pale.     Findings: No rash.  Neurological:     Mental Status: He is alert and oriented to person, place, and time.  Psychiatric:        Behavior: Behavior normal.     ED Results / Procedures / Treatments   Labs (all labs ordered are listed, but only abnormal results are displayed)  Labs Reviewed - No data to display   EKG None  Radiology CT Renal Stone Study Result Date: 11/20/2023 CLINICAL DATA:  Flank pain and hematuria, initial encounter EXAM: CT ABDOMEN AND PELVIS WITHOUT CONTRAST TECHNIQUE: Multidetector CT imaging of the abdomen and pelvis was performed following the standard protocol without IV contrast. RADIATION DOSE REDUCTION: This exam was performed according to the departmental dose-optimization program which includes automated exposure control, adjustment of the mA and/or kV according to patient size and/or use of iterative reconstruction technique. COMPARISON:  07/04/2022 FINDINGS: Lower chest: Right lower lobe consolidation is noted consistent with acute pneumonia. Hepatobiliary: No focal liver abnormality is seen. No gallstones, gallbladder wall thickening, or biliary dilatation. Pancreas: Unremarkable. No pancreatic ductal dilatation or surrounding inflammatory changes. Spleen: Normal in size  without focal abnormality. Adrenals/Urinary Tract: Adrenal glands are within normal limits. Kidneys demonstrate a normal appearance with a left-sided simple renal cyst stable from prior exam. No follow-up is recommended. No obstructive changes are seen. No calculi are noted. The bladder is decompressed. Stomach/Bowel: Changes consistent with rectal impaction are noted. No stercoral colitis is seen. No obstructive or inflammatory changes of the colon are noted. The appendix is within normal limits. Small bowel and stomach are unremarkable. Vascular/Lymphatic: Aortic atherosclerosis. No enlarged abdominal or pelvic lymph nodes. Reproductive: Prostate is unremarkable. Other: No abdominal wall hernia or abnormality. No abdominopelvic ascites. Musculoskeletal: No acute or significant osseous findings. IMPRESSION: Right lower lobe pneumonia. Changes consistent with rectal impaction. No findings to correspond with the given clinical history of hematuria are noted. Electronically Signed   By: Alcide Clever M.D.   On: 11/20/2023 00:43    Procedures Procedures    Medications Ordered in ED Medications - No data to display  ED Course/ Medical Decision Making/ A&P                                 Medical Decision Making Amount and/or Complexity of Data Reviewed Labs: ordered. Radiology: ordered.  Risk Prescription drug management.   73 yo M with a chief complaints of likely hematuria.  Patient is severely demented so unable to get any history from him.  Per family he was found this evening with some blood in his diaper.  His foreskin has not been retracted in years.  It limits examination.  He does have some liquid noted in the area that does appear to be bloody.  Shared decision making at bedside.  Could be difficult to obtain a urine sample from him.  Will obtain CT stone study to assess for intra-abdominal pathology versus kidney stone.  Will attempt to obtain a UA through either the male pure wick system  or by condom catheter.  CT concerning for right lower lobe pneumonia.  Family does say the patient has been coughing since about December.  They have really noticed any significant change recently.  Patient is also constipated.  Discussed this with the family.  After some discussion at bedside family will defer urine testing at this time.  Will follow-up with their PCP.  6:44 AM:  I have discussed the diagnosis/risks/treatment options with the patient and family.  Evaluation and diagnostic testing in the emergency department does not suggest an emergent condition requiring admission or immediate intervention beyond what has been performed at this time.  They will follow up with PCP. We also discussed returning to the ED immediately if new or worsening sx occur. We discussed the  sx which are most concerning (e.g., sudden worsening pain, fever, inability to tolerate by mouth) that necessitate immediate return. Medications administered to the patient during their visit and any new prescriptions provided to the patient are listed below.  Medications given during this visit Medications - No data to display   The patient appears reasonably screen and/or stabilized for discharge and I doubt any other medical condition or other [facility] requiring further screening, evaluation, or treatment in the ED at this time prior to discharge.          Final Clinical Impression(s) / ED Diagnoses Final diagnoses:  Pneumonia of right lower lobe due to infectious organism    Rx / DC Orders ED Discharge Orders          Ordered    cephALEXin (KEFLEX) 500 MG capsule  4 times daily        11/20/23 0102    azithromycin (ZITHROMAX) 250 MG tablet  Daily        11/20/23 0102              Melene Plan, DO 11/20/23 [phone]

## 2023-11-20 NOTE — ED Notes (Signed)
 Condom cath applied, will monitor output.

## 2023-11-20 NOTE — Discharge Instructions (Addendum)
 The CT scan showed pneumonia in the right lower lung.  It also showed that he is a bit constipated.  Please call your doctor and let them know you came to the ER and see when they will to see you in the office.  Typically if you have blood in your urine we refer you to the urologist.  You can talk with your doctor and see what they recommend.  I did put the information for the urologist on this paperwork.

## 2023-11-23 DIAGNOSIS — L89159 Pressure ulcer of sacral region, unspecified stage: Secondary | ICD-10-CM | POA: Diagnosis not present

## 2023-11-23 DIAGNOSIS — N471 Phimosis: Secondary | ICD-10-CM | POA: Diagnosis not present

## 2023-11-23 DIAGNOSIS — R9389 Abnormal findings on diagnostic imaging of other specified body structures: Secondary | ICD-10-CM | POA: Diagnosis not present

## 2023-12-21 DIAGNOSIS — R310 Gross hematuria: Secondary | ICD-10-CM | POA: Diagnosis not present

## 2023-12-21 DIAGNOSIS — N471 Phimosis: Secondary | ICD-10-CM | POA: Diagnosis not present

## 2024-01-17 DIAGNOSIS — Z0000 Encounter for general adult medical examination without abnormal findings: Secondary | ICD-10-CM | POA: Diagnosis not present

## 2024-01-17 DIAGNOSIS — E7800 Pure hypercholesterolemia, unspecified: Secondary | ICD-10-CM | POA: Diagnosis not present

## 2024-01-17 DIAGNOSIS — L89159 Pressure ulcer of sacral region, unspecified stage: Secondary | ICD-10-CM | POA: Diagnosis not present

## 2024-01-17 DIAGNOSIS — Z23 Encounter for immunization: Secondary | ICD-10-CM | POA: Diagnosis not present

## 2024-01-17 DIAGNOSIS — N1831 Chronic kidney disease, stage 3a: Secondary | ICD-10-CM | POA: Diagnosis not present

## 2024-01-17 DIAGNOSIS — Z1331 Encounter for screening for depression: Secondary | ICD-10-CM | POA: Diagnosis not present

## 2024-01-17 DIAGNOSIS — N471 Phimosis: Secondary | ICD-10-CM | POA: Diagnosis not present

## 2024-01-17 DIAGNOSIS — F0390 Unspecified dementia without behavioral disturbance: Secondary | ICD-10-CM | POA: Diagnosis not present

## 2024-04-05 ENCOUNTER — Encounter: Payer: Self-pay | Admitting: Advanced Practice Midwife

## 2024-07-18 DIAGNOSIS — E7800 Pure hypercholesterolemia, unspecified: Secondary | ICD-10-CM | POA: Diagnosis not present

## 2024-07-18 DIAGNOSIS — Z23 Encounter for immunization: Secondary | ICD-10-CM | POA: Diagnosis not present

## 2024-07-18 DIAGNOSIS — F0390 Unspecified dementia without behavioral disturbance: Secondary | ICD-10-CM | POA: Diagnosis not present

## 2024-07-18 DIAGNOSIS — N471 Phimosis: Secondary | ICD-10-CM | POA: Diagnosis not present

## 2024-07-18 DIAGNOSIS — N1831 Chronic kidney disease, stage 3a: Secondary | ICD-10-CM | POA: Diagnosis not present
